# Patient Record
Sex: Male | Born: 1965
Health system: Southern US, Community
[De-identification: ages and names within clinical notes are randomized; demographics above are authoritative.]

## PROBLEM LIST (undated history)

## (undated) DIAGNOSIS — K859 Acute pancreatitis without necrosis or infection, unspecified: Secondary | ICD-10-CM

## (undated) DIAGNOSIS — I10 Essential (primary) hypertension: Secondary | ICD-10-CM

## (undated) DIAGNOSIS — F1910 Other psychoactive substance abuse, uncomplicated: Secondary | ICD-10-CM

## (undated) HISTORY — DX: Essential (primary) hypertension: I10

## (undated) HISTORY — PX: OTHER SURGICAL HISTORY: SHX169

## (undated) HISTORY — PX: HEMORRHOID SURGERY: SHX153

---

## 2000-12-28 ENCOUNTER — Encounter: Payer: Self-pay | Admitting: Emergency Medicine

## 2000-12-28 ENCOUNTER — Emergency Department (HOSPITAL_COMMUNITY): Admission: EM | Admit: 2000-12-28 | Discharge: 2000-12-28 | Payer: Self-pay | Admitting: Emergency Medicine

## 2013-12-02 ENCOUNTER — Encounter (HOSPITAL_COMMUNITY): Payer: Self-pay | Admitting: Emergency Medicine

## 2013-12-02 ENCOUNTER — Emergency Department (INDEPENDENT_AMBULATORY_CARE_PROVIDER_SITE_OTHER)
Admission: EM | Admit: 2013-12-02 | Discharge: 2013-12-02 | Disposition: A | Discharge status: Home / Self Care | Payer: Self-pay | Source: Home / Self Care | Attending: Family Medicine | Admitting: Family Medicine

## 2013-12-02 DIAGNOSIS — I10 Essential (primary) hypertension: Secondary | ICD-10-CM

## 2013-12-02 DIAGNOSIS — F1010 Alcohol abuse, uncomplicated: Secondary | ICD-10-CM

## 2013-12-02 MED ORDER — HYDROCHLOROTHIAZIDE 25 MG PO TABS: 25.0000 mg | ORAL_TABLET | Freq: Every day | ORAL | Status: DC

## 2013-12-02 MED ORDER — AMLODIPINE BESYLATE 5 MG PO TABS: 5.0000 mg | ORAL_TABLET | Freq: Every day | ORAL | Status: DC

## 2013-12-02 MED ORDER — LISINOPRIL 40 MG PO TABS: 40.0000 mg | ORAL_TABLET | Freq: Every day | ORAL | Status: DC

## 2013-12-02 NOTE — ED Notes (Signed)
Out of medication for his BP for a couple of days

## 2013-12-02 NOTE — Discharge Instructions (Signed)
DASH Diet °The DASH diet stands for "Dietary Approaches to Stop Hypertension." It is a healthy eating plan that has been shown to reduce high blood pressure (hypertension) in as little as 14 days, while also possibly providing other significant health benefits. These other health benefits include reducing the risk of breast cancer after menopause and reducing the risk of type 2 diabetes, heart disease, colon cancer, and stroke. Health benefits also include weight loss and slowing kidney failure in patients with chronic kidney disease.  °DIET GUIDELINES °· Limit salt (sodium). Your diet should contain less than 1500 mg of sodium daily. °· Limit refined or processed carbohydrates. Your diet should include mostly whole grains. Desserts and added sugars should be used sparingly. °· Include small amounts of heart-healthy fats. These types of fats include nuts, oils, and tub margarine. Limit saturated and trans fats. These fats have been shown to be harmful in the body. °CHOOSING FOODS  °The following food groups are based on a 2000 calorie diet. See your Registered Dietitian for individual calorie needs. °Grains and Grain Products (6 to 8 servings daily) °· Eat More Often: Whole-wheat bread, brown rice, whole-grain or wheat pasta, quinoa, popcorn without added fat or salt (air popped). °· Eat Less Often: White bread, white pasta, white rice, cornbread. °Vegetables (4 to 5 servings daily) °· Eat More Often: Fresh, frozen, and canned vegetables. Vegetables may be raw, steamed, roasted, or grilled with a minimal amount of fat. °· Eat Less Often/Avoid: Creamed or fried vegetables. Vegetables in a cheese sauce. °Fruit (4 to 5 servings daily) °· Eat More Often: All fresh, canned (in natural juice), or frozen fruits. Dried fruits without added sugar. One hundred percent fruit juice (½ cup [237 mL] daily). °· Eat Less Often: Dried fruits with added sugar. Canned fruit in light or heavy syrup. °Lean Meats, Fish, and Poultry (2  servings or less daily. One serving is 3 to 4 oz [85-114 g]). °· Eat More Often: Ninety percent or leaner ground beef, tenderloin, sirloin. Round cuts of beef, chicken breast, turkey breast. All fish. Grill, bake, or broil your meat. Nothing should be fried. °· Eat Less Often/Avoid: Fatty cuts of meat, turkey, or chicken leg, thigh, or wing. Fried cuts of meat or fish. °Dairy (2 to 3 servings) °· Eat More Often: Low-fat or fat-free milk, low-fat plain or light yogurt, reduced-fat or part-skim cheese. °· Eat Less Often/Avoid: Milk (whole, 2%). Whole milk yogurt. Full-fat cheeses. °Nuts, Seeds, and Legumes (4 to 5 servings per week) °· Eat More Often: All without added salt. °· Eat Less Often/Avoid: Salted nuts and seeds, canned beans with added salt. °Fats and Sweets (limited) °· Eat More Often: Vegetable oils, tub margarines without trans fats, sugar-free gelatin. Mayonnaise and salad dressings. °· Eat Less Often/Avoid: Coconut oils, palm oils, butter, stick margarine, cream, half and half, cookies, candy, pie. °FOR MORE INFORMATION °The Dash Diet Eating Plan: www.dashdiet.org °Document Released: 07/28/2011 Document Revised: 10/31/2011 Document Reviewed: 07/28/2011 °ExitCare® Patient Information ©2014 [employer]. ° °Arterial Hypertension °Arterial hypertension (high blood pressure) is a condition of elevated pressure in your blood vessels. Hypertension over a long period of time is a risk factor for strokes, heart attacks, and heart failure. It is also the leading cause of kidney (renal) failure.  °CAUSES  °· In Adults -- Over 90% of all hypertension has no known cause. This is called essential or primary hypertension. In the other 10% of people with hypertension, the increase in blood pressure is caused by another disorder. This   is called secondary hypertension. Important causes of secondary hypertension are: °· Heavy alcohol use. °· Obstructive sleep apnea. °· Hyperaldosterosim (Conn's syndrome). °· Steroid  use. °· Chronic kidney failure. °· Hyperparathyroidism. °· Medications. °· Renal artery stenosis. °· Pheochromocytoma. °· Cushing's disease. °· Coarctation of the aorta. °· Scleroderma renal crisis. °· Licorice (in excessive amounts). °· Drugs (cocaine, methamphetamine). °Your caregiver can explain any items above that apply to you. °· In Children -- Secondary hypertension is more common and should always be considered. °· Pregnancy -- Few women of childbearing age have high blood pressure. However, up to 10% of them develop hypertension of pregnancy. Generally, this will not harm the woman. It may be a sign of 3 complications of pregnancy: preeclampsia, HELLP syndrome, and eclampsia. Follow up and control with medication is necessary. °SYMPTOMS  °· This condition normally does not produce any noticeable symptoms. It is usually found during a routine exam. °· Malignant hypertension is a late problem of high blood pressure. It may have the following symptoms: °· Headaches. °· Blurred vision. °· End-organ damage (this means your kidneys, heart, lungs, and other organs are being damaged). °· Stressful situations can increase the blood pressure. If a person with normal blood pressure has their blood pressure go up while being seen by their caregiver, this is often termed "white coat hypertension." Its importance is not known. It may be related with eventually developing hypertension or complications of hypertension. °· Hypertension is often confused with mental tension, stress, and anxiety. °DIAGNOSIS  °The diagnosis is made by 3 separate blood pressure measurements. They are taken at least 1 week apart from each other. If there is organ damage from hypertension, the diagnosis may be made without repeat measurements. °Hypertension is usually identified by having blood pressure readings: °· Above 140/90 mmHg measured in both arms, at 3 separate times, over a couple weeks. °· Over 130/80 mmHg should be considered a risk  factor and may require treatment in patients with diabetes. °Blood pressure readings over 120/80 mmHg are called "pre-hypertension" even in non-diabetic patients. °To get a true blood pressure measurement, use the following guidelines. Be aware of the factors that can alter blood pressure readings. °· Take measurements at least 1 hour after caffeine. °· Take measurements 30 minutes after smoking and without any stress. This is another reason to quit smoking  it raises your blood pressure. °· Use a proper cuff size. Ask your caregiver if you are not sure about your cuff size. °· Most home blood pressure cuffs are automatic. They will measure systolic and diastolic pressures. The systolic pressure is the pressure reading at the start of sounds. Diastolic pressure is the pressure at which the sounds disappear. If you are elderly, measure pressures in multiple postures. Try sitting, lying or standing. °· Sit at rest for a minimum of 5 minutes before taking measurements. °· You should not be on any medications like decongestants. These are found in many cold medications. °· Record your blood pressure readings and review them with your caregiver. °If you have hypertension: °· Your caregiver may do tests to be sure you do not have secondary hypertension (see "causes" above). °· Your caregiver may also look for signs of metabolic syndrome. This is also called Syndrome X or Insulin Resistance Syndrome. You may have this syndrome if you have type 2 diabetes, abdominal obesity, and abnormal blood lipids in addition to hypertension. °· Your caregiver will take your medical and family history and perform a physical exam. °· Diagnostic tests   may include blood tests (for glucose, cholesterol, potassium, and kidney function), a urinalysis, or an EKG. Other tests may also be necessary depending on your condition. °PREVENTION  °There are important lifestyle issues that you can adopt to reduce your chance of developing  hypertension: °· Maintain a normal weight. °· Limit the amount of salt (sodium) in your diet. °· Exercise often. °· Limit alcohol intake. °· Get enough potassium in your diet. Discuss specific advice with your caregiver. °· Follow a DASH diet (dietary approaches to stop hypertension). This diet is rich in fruits, vegetables, and low-fat dairy products, and avoids certain fats. °PROGNOSIS  °Essential hypertension cannot be cured. Lifestyle changes and medical treatment can lower blood pressure and reduce complications. The prognosis of secondary hypertension depends on the underlying cause. Many people whose hypertension is controlled with medicine or lifestyle changes can live a normal, healthy life.  °RISKS AND COMPLICATIONS  °While high blood pressure alone is not an illness, it often requires treatment due to its short- and long-term effects on many organs. Hypertension increases your risk for: °· CVAs or strokes (cerebrovascular accident). °· Heart failure due to chronically high blood pressure (hypertensive cardiomyopathy). °· Heart attack (myocardial infarction). °· Damage to the retina (hypertensive retinopathy). °· Kidney failure (hypertensive nephropathy). °Your caregiver can explain list items above that apply to you. Treatment of hypertension can significantly reduce the risk of complications. °TREATMENT  °· For overweight patients, weight loss and regular exercise are recommended. Physical fitness lowers blood pressure. °· Mild hypertension is usually treated with diet and exercise. A diet rich in fruits and vegetables, fat-free dairy products, and foods low in fat and salt (sodium) can help lower blood pressure. Decreasing salt intake decreases blood pressure in a 1/3 of people. °· Stop smoking if you are a smoker. °The steps above are highly effective in reducing blood pressure. While these actions are easy to suggest, they are difficult to achieve. Most patients with moderate or severe hypertension  end up requiring medications to bring their blood pressure down to a normal level. There are several classes of medications for treatment. Blood pressure pills (antihypertensives) will lower blood pressure by their different actions. Lowering the blood pressure by 10 mmHg may decrease the risk of complications by as much as 25%. °The goal of treatment is effective blood pressure control. This will reduce your risk for complications. Your caregiver will help you determine the best treatment for you according to your lifestyle. What is excellent treatment for one person, may not be for you. °HOME CARE INSTRUCTIONS  °· Do not smoke. °· Follow the lifestyle changes outlined in the "Prevention" section. °· If you are on medications, follow the directions carefully. Blood pressure medications must be taken as prescribed. Skipping doses reduces their benefit. It also puts you at risk for problems. °· Follow up with your caregiver, as directed. °· If you are asked to monitor your blood pressure at home, follow the guidelines in the "Diagnosis" section above. °SEEK MEDICAL CARE IF:  °· You think you are having medication side effects. °· You have recurrent headaches or lightheadedness. °· You have swelling in your ankles. °· You have trouble with your vision. °SEEK IMMEDIATE MEDICAL CARE IF:  °· You have sudden onset of chest pain or pressure, difficulty breathing, or other symptoms of a heart attack. °· You have a severe headache. °· You have symptoms of a stroke (such as sudden weakness, difficulty speaking, difficulty walking). °MAKE SURE YOU:  °· Understand these instructions. °·   Will watch your condition.  Will get help right away if you are not doing well or get worse. Document Released: 08/08/2005 Document Revised: 10/31/2011 Document Reviewed: 03/08/2007 [facility] Patient Information 2014 [place]ExitCare, [place].  How Much is Too Much Alcohol? Drinking too much alcohol can cause injury, accidents, and health problems.  These types of problems can include:   Car crashes.  Falls.  Family fighting (domestic violence).  Drowning.  Fights.  Injuries.  Burns.  Damage to certain organs.  Having a baby with birth defects. ONE DRINK CAN BE TOO MUCH WHEN YOU ARE:  Working.  Pregnant or breastfeeding.  Taking medicines. Ask your doctor.  Driving or planning to drive. WHAT IS A STANDARD DRINK?   1 regular beer (12 ounces or 360 milliliters).  1 glass of wine (5 ounces or 150 milliliters).  1 shot of liquor (1.5 ounces or 45 milliliters). BLOOD ALCOHOL LEVELS   .00 A person is sober.  Marland Kitchen.03 A person has no trouble keeping balance, talking, or seeing right, but a "buzz" may be felt.  Marland Kitchen.05 A person feels "buzzed" and relaxed.  Marland Kitchen.08 or .10  A person is drunk. He or she has trouble talking, seeing right, and keeping his or her balance.  .15 A person loses body control and may pass out (blackout).  .20 A person has trouble walking (staggering) and throws up (vomits).  .30 A person will pass out (unconscious).  .40+ A person will be in a coma. Death is possible. If you or someone you know has a drinking problem, get help from a doctor.  Document Released: 06/04/2009 Document Revised: 10/31/2011 Document Reviewed: 06/04/2009 [facility] PlaceExitCare Patient Information 2014 [place]ExitCare, MarylandLLC.  Hypertension As your heart beats, it forces blood through your arteries. This force is your blood pressure. If the pressure is too high, it is called hypertension (HTN) or high blood pressure. HTN is dangerous because you may have it and not know it. High blood pressure may mean that your heart has to work harder to pump blood. Your arteries may be narrow or stiff. The extra work puts you at risk for heart disease, stroke, and other problems.  Blood pressure consists of two numbers, a higher number over a lower, 110/72, for example. It is stated as "110 over 72." The ideal is below 120 for the top number (systolic) and under  80 for the bottom (diastolic). Write down your blood pressure today. You should pay close attention to your blood pressure if you have certain conditions such as:  Heart failure.  Prior heart attack.  Diabetes  Chronic kidney disease.  Prior stroke.  Multiple risk factors for heart disease. To see if you have HTN, your blood pressure should be measured while you are seated with your arm held at the level of the heart. It should be measured at least twice. A one-time elevated blood pressure reading (especially in the Emergency Department) does not mean that you need treatment. There may be conditions in which the blood pressure is different between your right and left arms. It is important to see your caregiver soon for a recheck. Most people have essential hypertension which means that there is not a specific cause. This type of high blood pressure may be lowered by changing lifestyle factors such as:  Stress.  Smoking.  Lack of exercise.  Excessive weight.  Drug/tobacco/alcohol use.  Eating less salt. Most people do not have symptoms from high blood pressure until it has caused damage to the body. Effective treatment  can often prevent, delay or reduce that damage. TREATMENT  When a cause has been identified, treatment for high blood pressure is directed at the cause. There are a large number of medications to treat HTN. These fall into several categories, and your caregiver will help you select the medicines that are best for you. Medications may have side effects. You should review side effects with your caregiver. If your blood pressure stays high after you have made lifestyle changes or started on medicines,   Your medication(s) may need to be changed.  Other problems may need to be addressed.  Be certain you understand your prescriptions, and know how and when to take your medicine.  Be sure to follow up with your caregiver within the time frame advised (usually within two  weeks) to have your blood pressure rechecked and to review your medications.  If you are taking more than one medicine to lower your blood pressure, make sure you know how and at what times they should be taken. Taking two medicines at the same time can result in blood pressure that is too low. SEEK IMMEDIATE MEDICAL CARE IF:  You develop a severe headache, blurred or changing vision, or confusion.  You have unusual weakness or numbness, or a faint feeling.  You have severe chest or abdominal pain, vomiting, or breathing problems. MAKE SURE YOU:   Understand these instructions.  Will watch your condition.  Will get help right away if you are not doing well or get worse. Document Released: 08/08/2005 Document Revised: 10/31/2011 Document Reviewed: 03/28/2008 [facility]ExitCare Patient Information 2014 [place]ExitCare, MarylandLLC.

## 2013-12-02 NOTE — ED Provider Notes (Signed)
CSN: [id number]     Arrival date & time 12/02/13  1644 History   First MD Initiated Contact with Patient 12/02/13 1745     Chief Complaint  Patient presents with  . Medication Refill   (Consider location/radiation/quality/duration/timing/severity/associated sxs/prior Treatment) HPI Comments: Patient states he was recently discharge from prison and has run out of medications used to control his blood pressure. Significant other also reports that he has returned to drinking 8-10 beers a day. He has not yet made the effort to locate a primary care provider. [facility]   The history is provided by the patient and the spouse.    History reviewed. No pertinent past medical history. History reviewed. No pertinent past surgical history. History reviewed. No pertinent family history. History  Substance Use Topics  . Smoking status: Current Every Day Smoker  . Smokeless tobacco: Not on file  . Alcohol Use: Yes    Review of Systems  All other systems reviewed and are negative.   Allergies  Review of patient's allergies indicates no known allergies.  Home Medications   Current Outpatient Rx  Name  Route  Sig  Dispense  Refill  . amLODipine (NORVASC) 5 MG tablet   Oral   Take 5 mg by mouth daily.         Marland Kitchen. aspirin (ASPIRIN EC) 81 MG EC tablet   Oral   Take 81 mg by mouth daily. Swallow whole.         . hydrochlorothiazide (HYDRODIURIL) 25 MG tablet   Oral   Take 25 mg by mouth daily.         Marland Kitchen. lisinopril (PRINIVIL,ZESTRIL) 40 MG tablet   Oral   Take 40 mg by mouth daily.         Marland Kitchen. amLODipine (NORVASC) 5 MG tablet   Oral   Take 1 tablet (5 mg total) by mouth daily.   30 tablet   1   . hydrochlorothiazide (HYDRODIURIL) 25 MG tablet   Oral   Take 1 tablet (25 mg total) by mouth daily.   30 tablet   1   . lisinopril (PRINIVIL,ZESTRIL) 40 MG tablet   Oral   Take 1 tablet (40 mg total) by mouth daily.   30 tablet   1    BP  159/77  Pulse 88  Temp(Src) 99.1 F (37.3 C) (Oral)  Resp 16  SpO2 100% Physical Exam  Nursing note and vitals reviewed. Constitutional: He is oriented to person, place, and time. He appears well-developed and well-nourished. No distress.  HENT:  Head: Normocephalic and atraumatic.  Neck: No JVD present.  Cardiovascular: Regular rhythm and normal heart sounds.  Tachycardia present.   Pulmonary/Chest: Effort normal and breath sounds normal.  Abdominal: Soft. Bowel sounds are normal. He exhibits no distension. There is no tenderness.  Musculoskeletal: Normal range of motion.  Neurological: He is alert and oriented to person, place, and time.  Skin: Skin is warm and dry. No rash noted.  Psychiatric: He has a normal mood and affect. His behavior is normal.    ED Course  Procedures (including critical care time) Labs Review Labs Reviewed - No data to display Imaging Review No results found.   MDM   1. Hypertension   2. Alcohol abuse, daily use    Provided limited Rxs for patient's daily BP medications and [facility] information [facility].   Jess BartersJennifer Lee Los ArcosPresson, GeorgiaPA 12/02/13 [phone]

## 2013-12-03 NOTE — ED Provider Notes (Signed)
Medical screening examination/treatment/procedure(s) were performed by resident physician or non-physician practitioner and as supervising physician I was immediately available for consultation/collaboration.   KINDL,Zhaire DOUGLAS MD.   Oluwadarasimi D Kindl, MD 12/03/13 0923 

## 2014-01-31 ENCOUNTER — Encounter (HOSPITAL_COMMUNITY): Payer: Self-pay | Admitting: Emergency Medicine

## 2014-01-31 ENCOUNTER — Emergency Department (INDEPENDENT_AMBULATORY_CARE_PROVIDER_SITE_OTHER)
Admission: EM | Admit: 2014-01-31 | Discharge: 2014-01-31 | Disposition: A | Discharge status: Home / Self Care | Payer: Self-pay | Source: Home / Self Care | Attending: Emergency Medicine | Admitting: Emergency Medicine

## 2014-01-31 DIAGNOSIS — I10 Essential (primary) hypertension: Secondary | ICD-10-CM

## 2014-01-31 MED ORDER — AMLODIPINE BESYLATE 5 MG PO TABS
5.0000 mg | ORAL_TABLET | Freq: Every day | ORAL | Status: DC
Start: 2014-01-31 — End: 2014-05-02

## 2014-01-31 MED ORDER — LISINOPRIL 40 MG PO TABS: 40.0000 mg | ORAL_TABLET | Freq: Every day | ORAL | Status: DC

## 2014-01-31 MED ORDER — HYDROCHLOROTHIAZIDE 25 MG PO TABS
25.0000 mg | ORAL_TABLET | Freq: Every day | ORAL | Status: DC
Start: 2014-01-31 — End: 2014-05-02

## 2014-01-31 NOTE — ED Notes (Addendum)
Reports needing medication refill-took last blood pressure medicine this am. Patient reports he is trying to get medicaid

## 2014-01-31 NOTE — Discharge Instructions (Signed)

## 2014-01-31 NOTE — ED Provider Notes (Signed)
Medical screening examination/treatment/procedure(s) were performed by non-physician practitioner and as supervising physician I was immediately available for consultation/collaboration.  Immaculate Crutcher, M.D.  Kaysin Brock C Rylen Swindler, MD 01/31/14 2205 

## 2014-01-31 NOTE — ED Provider Notes (Signed)
CSN: [id number]     Arrival date & time 01/31/14  1257 History   First MD Initiated Contact with Patient 01/31/14 1432     Chief Complaint  Patient presents with  . Medication Refill   (Consider location/radiation/quality/duration/timing/severity/associated sxs/prior Treatment) HPI Comments: Patient presents requesting refills of his medications for hypertension. He takes amlodipine 5 mg, HCTZ 25 mg, and lisinopril 40 mg daily. He took his last dose of these medications this morning. He was seen here about 2 months ago and was prescribed 2 months of these medications and was instructed to followup with primary care. He claims that he looked for the primary care office that he was told to go to but he couldn't find it so she stopped looking. He denies any symptoms at this time. He is in the process of establishing Medicaid insurance.   History reviewed. No pertinent past medical history. History reviewed. No pertinent past surgical history. No family history on file. History  Substance Use Topics  . Smoking status: Current Every Day Smoker  . Smokeless tobacco: Not on file  . Alcohol Use: Yes    Review of Systems  Constitutional: Negative for fever, chills and fatigue.  HENT: Negative for sore throat.   Eyes: Negative for visual disturbance.  Respiratory: Negative for cough and shortness of breath.   Cardiovascular: Negative for chest pain, palpitations and leg swelling.  Gastrointestinal: Negative for nausea, vomiting, abdominal pain, diarrhea and constipation.  Genitourinary: Negative for dysuria, urgency, frequency and hematuria.  Musculoskeletal: Negative for arthralgias, myalgias, neck pain and neck stiffness.  Skin: Negative for rash.  Neurological: Negative for dizziness, weakness and light-headedness.  All other systems reviewed and are negative.   Allergies  Review of patient's allergies indicates no known allergies.  Home Medications   Prior to Admission medications    Medication Sig Start Date End Date Taking? Authorizing Provider  amLODipine (NORVASC) 5 MG tablet Take 5 mg by mouth daily.   Yes Historical Provider, MD  aspirin (ASPIRIN EC) 81 MG EC tablet Take 81 mg by mouth daily. Swallow whole.   Yes Historical Provider, MD  hydrochlorothiazide (HYDRODIURIL) 25 MG tablet Take 25 mg by mouth daily.   Yes Historical Provider, MD  lisinopril (PRINIVIL,ZESTRIL) 40 MG tablet Take 40 mg by mouth daily.   Yes Historical Provider, MD  amLODipine (NORVASC) 5 MG tablet Take 1 tablet (5 mg total) by mouth daily. 01/31/14   Graylon GoodZachary H Ciin Brazzel, PA-C  hydrochlorothiazide (HYDRODIURIL) 25 MG tablet Take 1 tablet (25 mg total) by mouth daily. 01/31/14   Adrian BlackwaterZachary H Troy Hartzog, PA-C  lisinopril (PRINIVIL,ZESTRIL) 40 MG tablet Take 1 tablet (40 mg total) by mouth daily. 01/31/14   Adrian BlackwaterZachary H Alzena Gerber, PA-C   BP 159/88  Pulse 94  Temp(Src) 98.6 F (37 C) (Oral)  Resp 16  SpO2 96% Physical Exam  Nursing note and vitals reviewed. Constitutional: He is oriented to person, place, and time. He appears well-developed and well-nourished. No distress.  HENT:  Head: Normocephalic.  Cardiovascular: Normal rate, regular rhythm and normal heart sounds.  Exam reveals no gallop and no friction rub.   No murmur heard. No peripheral edema   Pulmonary/Chest: Effort normal and breath sounds normal. No respiratory distress. He has no wheezes. He has no rales.  Neurological: He is alert and oriented to person, place, and time. Coordination normal.  Skin: Skin is warm and dry. No rash noted. He is not diaphoretic.  Psychiatric: He has a normal mood and affect. Judgment normal.  ED Course  Procedures (including critical care time) Labs Review Labs Reviewed - No data to display  Imaging Review No results found.   MDM   1. Hypertension    will refill medications. I have discussed with him the importance of establishing a primary care provider. I will give him 2 months of medications again,  but I have strongly encouraged him to establish care with a primary care physician before his medications run out.   Meds ordered this encounter  Medications  . amLODipine (NORVASC) 5 MG tablet    Sig: Take 1 tablet (5 mg total) by mouth daily.    Dispense:  30 tablet    Refill:  1  . hydrochlorothiazide (HYDRODIURIL) 25 MG tablet    Sig: Take 1 tablet (25 mg total) by mouth daily.    Dispense:  30 tablet    Refill:  1  . lisinopril (PRINIVIL,ZESTRIL) 40 MG tablet    Sig: Take 1 tablet (40 mg total) by mouth daily.    Dispense:  30 tablet    Refill:  1      Graylon GoodZachary H Draper Gallon, PA-C 01/31/14 1948

## 2014-05-02 ENCOUNTER — Encounter (HOSPITAL_COMMUNITY): Payer: Self-pay | Admitting: Emergency Medicine

## 2014-05-02 ENCOUNTER — Emergency Department (INDEPENDENT_AMBULATORY_CARE_PROVIDER_SITE_OTHER)
Admission: EM | Admit: 2014-05-02 | Discharge: 2014-05-02 | Disposition: A | Discharge status: Home / Self Care | Payer: Self-pay | Source: Home / Self Care | Attending: Family Medicine | Admitting: Family Medicine

## 2014-05-02 DIAGNOSIS — I10 Essential (primary) hypertension: Secondary | ICD-10-CM

## 2014-05-02 LAB — POCT I-STAT, CHEM 8
BUN: 7 mg/dL (ref 6–23)
CALCIUM ION: 1.12 mmol/L (ref 1.12–1.23)
CHLORIDE: 104 meq/L (ref 96–112)
Creatinine, Ser: 1 mg/dL (ref 0.50–1.35)
GLUCOSE: 109 mg/dL — AB (ref 70–99)
HCT: 51 % (ref 39.0–52.0)
Hemoglobin: 17.3 g/dL — ABNORMAL HIGH (ref 13.0–17.0)
Potassium: 4.2 mEq/L (ref 3.7–5.3)
Sodium: 137 mEq/L (ref 137–147)
TCO2: 25 mmol/L (ref 0–100)

## 2014-05-02 MED ORDER — HYDROCHLOROTHIAZIDE 25 MG PO TABS: 25.0000 mg | ORAL_TABLET | Freq: Every day | ORAL | Status: DC

## 2014-05-02 MED ORDER — LISINOPRIL 40 MG PO TABS: 40.0000 mg | ORAL_TABLET | Freq: Every day | ORAL | Status: DC

## 2014-05-02 MED ORDER — AMLODIPINE BESYLATE 5 MG PO TABS: 5.0000 mg | ORAL_TABLET | Freq: Every day | ORAL | Status: DC

## 2014-05-02 NOTE — Discharge Instructions (Signed)

## 2014-05-02 NOTE — ED Provider Notes (Signed)
Medical screening examination/treatment/procedure(s) were performed by resident physician or non-physician practitioner and as supervising physician I was immediately available for consultation/collaboration.   Beata Beason,Nahum DOUGLAS MD.   Ugo D Dalyn Becker, MD 05/02/14 1325 

## 2014-05-02 NOTE — ED Provider Notes (Signed)
CSN: [id number]     Arrival date & time 05/02/14  0844 History   First MD Initiated Contact with Patient 05/02/14 0913     Chief Complaint  Patient presents with  . Medication Refill   (Consider location/radiation/quality/duration/timing/severity/associated sxs/prior Treatment) HPI Comments: 48 year old male presents requesting refills of his blood pressure medications. Was seen here for this about 3 months ago, he says that he has not been able to establish primary care yet because he cannot get in touch with the people at the clinic. He denies any symptoms whatsoever at this time. No fever, chills, NVD, chest pain, shortness of breath, leg swelling.   History reviewed. No pertinent past medical history. History reviewed. No pertinent past surgical history. No family history on file. History  Substance Use Topics  . Smoking status: Current Every Day Smoker -- 0.50 packs/day    Types: Cigarettes  . Smokeless tobacco: Not on file  . Alcohol Use: Yes    Review of Systems  Constitutional: Negative for fever, chills and fatigue.  HENT: Negative for sore throat.   Eyes: Negative for visual disturbance.  Respiratory: Negative for cough and shortness of breath.   Cardiovascular: Negative for chest pain, palpitations and leg swelling.  Gastrointestinal: Negative for nausea, vomiting, abdominal pain, diarrhea and constipation.  Genitourinary: Negative for dysuria, urgency, frequency and hematuria.  Musculoskeletal: Negative for arthralgias, myalgias, neck pain and neck stiffness.  Skin: Negative for rash.  Neurological: Negative for dizziness, weakness and light-headedness.  All other systems reviewed and are negative.   Allergies  Review of patient's allergies indicates no known allergies.  Home Medications   Prior to Admission medications   Medication Sig Start Date End Date Taking? Authorizing Provider  aspirin (ASPIRIN EC) 81 MG EC tablet Take 81 mg by mouth daily. Swallow  whole.   Yes Historical Provider, MD  hydrochlorothiazide (HYDRODIURIL) 25 MG tablet Take 25 mg by mouth daily.   Yes Historical Provider, MD  amLODipine (NORVASC) 5 MG tablet Take 5 mg by mouth daily.    Historical Provider, MD  amLODipine (NORVASC) 5 MG tablet Take 1 tablet (5 mg total) by mouth daily. 05/02/14   Graylon Good, PA-C  hydrochlorothiazide (HYDRODIURIL) 25 MG tablet Take 1 tablet (25 mg total) by mouth daily. 05/02/14   Adrian Blackwater Mosiah Bastin, PA-C  lisinopril (PRINIVIL,ZESTRIL) 40 MG tablet Take 40 mg by mouth daily.    Historical Provider, MD  lisinopril (PRINIVIL,ZESTRIL) 40 MG tablet Take 1 tablet (40 mg total) by mouth daily. 05/02/14   Adrian Blackwater Masaye Gatchalian, PA-C   BP 158/86  Pulse 115  Temp(Src) 98.3 F (36.8 C) (Oral)  Resp 16  SpO2 98% Physical Exam  Nursing note and vitals reviewed. Constitutional: He is oriented to person, place, and time. He appears well-developed and well-nourished. No distress.  HENT:  Head: Normocephalic.  Cardiovascular: Normal rate, regular rhythm, normal heart sounds and intact distal pulses.   Pulse rechecked manually and was 85 beats per minute  Pulmonary/Chest: Effort normal and breath sounds normal. No respiratory distress.  Neurological: He is alert and oriented to person, place, and time. Coordination normal.  Skin: Skin is warm and dry. No rash noted. He is not diaphoretic.  Psychiatric: He has a normal mood and affect. Judgment normal.    ED Course  Procedures (including critical care time) Labs Review Labs Reviewed  POCT I-STAT, CHEM 8 - Abnormal; Notable for the following:    Glucose, Bld 109 (*)    Hemoglobin 17.3 (*)  All other components within normal limits    Imaging Review No results found.   MDM   1. Essential hypertension    Refilled for 1 month.  No more refills here, needs to establish PCP.  Istat normal.     Meds ordered this encounter  Medications  . amLODipine (NORVASC) 5 MG tablet    Sig: Take 1 tablet  (5 mg total) by mouth daily.    Dispense:  30 tablet    Refill:  0    Order Specific Question:  Supervising Provider    Answer:  Linna Hoff [phone]  . lisinopril (PRINIVIL,ZESTRIL) 40 MG tablet    Sig: Take 1 tablet (40 mg total) by mouth daily.    Dispense:  30 tablet    Refill:  0    Order Specific Question:  Supervising Provider    Answer:  Linna Hoff [phone]  . hydrochlorothiazide (HYDRODIURIL) 25 MG tablet    Sig: Take 1 tablet (25 mg total) by mouth daily.    Dispense:  30 tablet    Refill:  0    Order Specific Question:  Supervising Provider    Answer:  Bradd Canary D [[id number]]       Graylon Good, PA-C 05/02/14 [phone]

## 2014-05-02 NOTE — ED Notes (Signed)
Needing refill on his BP meds; ran out x5 days ago He is asymptomatic; does not have PCP Smokes 0.5 PPD Alert, no signs of acute distress.

## 2014-05-27 ENCOUNTER — Encounter: Payer: Self-pay | Admitting: Internal Medicine

## 2014-05-27 ENCOUNTER — Ambulatory Visit: Payer: Self-pay | Attending: Internal Medicine | Admitting: Internal Medicine

## 2014-05-27 VITALS — BP 140/90 | HR 100 | Temp 98.8°F | Resp 16 | Wt 170.4 lb

## 2014-05-27 DIAGNOSIS — I10 Essential (primary) hypertension: Secondary | ICD-10-CM

## 2014-05-27 DIAGNOSIS — Z23 Encounter for immunization: Secondary | ICD-10-CM

## 2014-05-27 DIAGNOSIS — Z139 Encounter for screening, unspecified: Secondary | ICD-10-CM

## 2014-05-27 DIAGNOSIS — Z823 Family history of stroke: Secondary | ICD-10-CM | POA: Insufficient documentation

## 2014-05-27 DIAGNOSIS — Z7982 Long term (current) use of aspirin: Secondary | ICD-10-CM | POA: Insufficient documentation

## 2014-05-27 DIAGNOSIS — F17200 Nicotine dependence, unspecified, uncomplicated: Secondary | ICD-10-CM

## 2014-05-27 DIAGNOSIS — Z833 Family history of diabetes mellitus: Secondary | ICD-10-CM

## 2014-05-27 DIAGNOSIS — F17210 Nicotine dependence, cigarettes, uncomplicated: Secondary | ICD-10-CM | POA: Insufficient documentation

## 2014-05-27 DIAGNOSIS — Z8249 Family history of ischemic heart disease and other diseases of the circulatory system: Secondary | ICD-10-CM | POA: Insufficient documentation

## 2014-05-27 MED ORDER — VARENICLINE TARTRATE 0.5 MG X 11 & 1 MG X 42 PO MISC: ORAL | Status: DC

## 2014-05-27 MED ORDER — HYDROCHLOROTHIAZIDE 25 MG PO TABS: 25.0000 mg | ORAL_TABLET | Freq: Every day | ORAL | Status: DC

## 2014-05-27 MED ORDER — LISINOPRIL 40 MG PO TABS: 40.0000 mg | ORAL_TABLET | Freq: Every day | ORAL | Status: DC

## 2014-05-27 MED ORDER — AMLODIPINE BESYLATE 5 MG PO TABS: 5.0000 mg | ORAL_TABLET | Freq: Every day | ORAL | Status: DC

## 2014-05-27 NOTE — Progress Notes (Signed)
Patient here to establish care Needs medications refilled

## 2014-05-27 NOTE — Progress Notes (Signed)
Patient Demographics  Jimmy Zimmerman, is a 48 y.o. male  UJW:[id number]  [id number]  DOB - [birth year]  CC:  Chief Complaint  Patient presents with  . Establish Care       HPI: Jimmy Zimmerman is a 48 y.o. male here today to establish medical care. History of hypertension for several years, currently patient taking amlodipine 5 mg hydrochlorothiazide 25 mg and lisinopril 40 mg, as per patient he took his medications today his blood pressure is borderline elevated denies any headache dizziness chest pain or shortness of breath, patient does smoke cigarettes, I have advised patient to quit smoking, he's going to try Chantix, denies any previous history of anxiety or depression, patient does report family history of diabetes. Patient has No headache, No chest pain, No abdominal pain - No Nausea, No new weakness tingling or numbness, No Cough - SOB.  No Known Allergies History reviewed. No pertinent past medical history. Current Outpatient Prescriptions on File Prior to Visit  Medication Sig Dispense Refill  . aspirin (ASPIRIN EC) 81 MG EC tablet Take 81 mg by mouth daily. Swallow whole.       No current facility-administered medications on file prior to visit.   Family History  Problem Relation Age of Onset  . Hypertension Mother   . Diabetes Mother   . Stroke Maternal Grandmother    History   Social History  . Marital Status: Single    Spouse Name: N/A    Number of Children: N/A  . Years of Education: N/A   Occupational History  . Not on file.   Social History Main Topics  . Smoking status: Current Every Day Smoker -- 0.50 packs/day for 30 years    Types: Cigarettes  . Smokeless tobacco: Not on file  . Alcohol Use: 3.6 oz/week    6 Cans of beer per week     Comment: everyday   . Drug Use: No  . Sexual Activity: Not on file   Other Topics Concern  . Not on file   Social History Narrative  . No narrative on file    Review of Systems: Constitutional:  Negative for fever, chills, diaphoresis, activity change, appetite change and fatigue. HENT: Negative for ear pain, nosebleeds, congestion, facial swelling, rhinorrhea, neck pain, neck stiffness and ear discharge.  Eyes: Negative for pain, discharge, redness, itching and visual disturbance. Respiratory: Negative for cough, choking, chest tightness, shortness of breath, wheezing and stridor.  Cardiovascular: Negative for chest pain, palpitations and leg swelling. Gastrointestinal: Negative for abdominal distention. Genitourinary: Negative for dysuria, urgency, frequency, hematuria, flank pain, decreased urine volume, difficulty urinating and dyspareunia.  Musculoskeletal: Negative for back pain, joint swelling, arthralgia and gait problem. Neurological: Negative for dizziness, tremors, seizures, syncope, facial asymmetry, speech difficulty, weakness, light-headedness, numbness and headaches.  Hematological: Negative for adenopathy. Does not bruise/bleed easily. Psychiatric/Behavioral: Negative for hallucinations, behavioral problems, confusion, dysphoric mood, decreased concentration and agitation.    Objective:   Filed Vitals:   05/27/14 1119  BP: 140/90  Pulse:   Temp:   Resp:     Physical Exam: Constitutional: Patient appears well-developed and well-nourished. No distress. HENT: Normocephalic, atraumatic, External right and left ear normal. Oropharynx is clear and moist.  Eyes: Conjunctivae and EOM are normal. PERRLA, no scleral icterus. Neck: Normal ROM. Neck supple. No JVD. No tracheal deviation. No thyromegaly. CVS: RRR, S1/S2 +, no murmurs, no gallops, no carotid bruit.  Pulmonary: Effort and breath sounds normal, no stridor, rhonchi, wheezes, rales.  Abdominal:  Soft. BS +, no distension, tenderness, rebound or guarding.  Musculoskeletal: Normal range of motion. No edema and no tenderness.  Neuro: Alert. Normal reflexes, muscle tone coordination. No cranial nerve deficit. Skin:  Skin is warm and dry. No rash noted. Not diaphoretic. No erythema. No pallor. Psychiatric: Normal mood and affect. Behavior, judgment, thought content normal.  Lab Results  Component Value Date   HGB 17.3* 05/02/2014   HCT 51.0 05/02/2014   Lab Results  Component Value Date   CREATININE 1.00 05/02/2014   BUN 7 05/02/2014   NA 137 05/02/2014   K 4.2 05/02/2014   CL 104 05/02/2014    No results found for this basename: HGBA1C   Lipid Panel  No results found for this basename: chol, trig, hdl, cholhdl, vldl, ldlcalc       Assessment and plan:   1. Encounter for immunization Flu shot given today.  2. Essential hypertension Advise for DASH diet  Medication refill done.  - lisinopril (PRINIVIL,ZESTRIL) 40 MG tablet; Take 1 tablet (40 mg total) by mouth daily.  Dispense: 30 tablet; Refill: 3 - hydrochlorothiazide (HYDRODIURIL) 25 MG tablet; Take 1 tablet (25 mg total) by mouth daily.  Dispense: 30 tablet; Refill: 3 - amLODipine (NORVASC) 5 MG tablet; Take 1 tablet (5 mg total) by mouth daily.  Dispense: 30 tablet; Refill: 3  3. Nicotine dependence, uncomplicated, unspecified nicotine product type Prescribed  - varenicline (CHANTIX STARTING MONTH PAK) 0.5 MG X 11 & 1 MG X 42 tablet; Take one 0.5 mg tablet by mouth once daily for 3 days, then increase to one 0.5 mg tablet twice daily for 4 days, then increase to one 1 mg tablet twice daily.  Dispense: 53 tablet; Refill: 0  4. Family history of diabetes mellitus (DM) Will check  - Hemoglobin A1c  5. Screening Ordered baseline blood work - CBC with Differential - COMPLETE METABOLIC PANEL WITH GFR - TSH - Lipid panel - Vit D  25 hydroxy (rtn osteoporosis monitoring)        Health Maintenance   -Influenza shot given today   Return in about 3 months (around 08/27/2014) for hypertension.      Doris Cheadle, MD

## 2014-05-27 NOTE — Patient Instructions (Signed)
DASH Eating Plan °DASH stands for "Dietary Approaches to Stop Hypertension." The DASH eating plan is a healthy eating plan that has been shown to reduce high blood pressure (hypertension). Additional health benefits may include reducing the risk of type 2 diabetes mellitus, heart disease, and stroke. The DASH eating plan may also help with weight loss. °WHAT DO I NEED TO KNOW ABOUT THE DASH EATING PLAN? °For the DASH eating plan, you will follow these general guidelines: °· Choose foods with a percent daily value for sodium of less than 5% (as listed on the food label). °· Use salt-free seasonings or herbs instead of table salt or sea salt. °· Check with your health care provider or pharmacist before using salt substitutes. °· Eat lower-sodium products, often labeled as "lower sodium" or "no salt added." °· Eat fresh foods. °· Eat more vegetables, fruits, and low-fat dairy products. °· Choose whole grains. Look for the word "whole" as the first word in the ingredient list. °· Choose fish and skinless chicken or turkey more often than red meat. Limit fish, poultry, and meat to 6 oz (170 g) each day. °· Limit sweets, desserts, sugars, and sugary drinks. °· Choose heart-healthy fats. °· Limit cheese to 1 oz (28 g) per day. °· Eat more home-cooked food and less restaurant, buffet, and fast food. °· Limit fried foods. °· Cook foods using methods other than frying. °· Limit canned vegetables. If you do use them, rinse them well to decrease the sodium. °· When eating at a restaurant, ask that your food be prepared with less salt, or no salt if possible. °WHAT FOODS CAN I EAT? °Seek help from a dietitian for individual calorie needs. °Grains °Whole grain or whole wheat bread. Brown rice. Whole grain or whole wheat pasta. Quinoa, bulgur, and whole grain cereals. Low-sodium cereals. Corn or whole wheat flour tortillas. Whole grain cornbread. Whole grain crackers. Low-sodium crackers. °Vegetables °Fresh or frozen vegetables  (raw, steamed, roasted, or grilled). Low-sodium or reduced-sodium tomato and vegetable juices. Low-sodium or reduced-sodium tomato sauce and paste. Low-sodium or reduced-sodium canned vegetables.  °Fruits °All fresh, canned (in natural juice), or frozen fruits. °Meat and Other Protein Products °Ground beef (85% or leaner), grass-fed beef, or beef trimmed of fat. Skinless chicken or turkey. Ground chicken or turkey. Pork trimmed of fat. All fish and seafood. Eggs. Dried beans, peas, or lentils. Unsalted nuts and seeds. Unsalted canned beans. °Dairy °Low-fat dairy products, such as skim or 1% milk, 2% or reduced-fat cheeses, low-fat ricotta or cottage cheese, or plain low-fat yogurt. Low-sodium or reduced-sodium cheeses. °Fats and Oils °Tub margarines without trans fats. Light or reduced-fat mayonnaise and salad dressings (reduced sodium). Avocado. Safflower, olive, or canola oils. Natural peanut or almond butter. °Other °Unsalted popcorn and pretzels. °The items listed above may not be a complete list of recommended foods or beverages. Contact your dietitian for more options. °WHAT FOODS ARE NOT RECOMMENDED? °Grains °White bread. White pasta. White rice. Refined cornbread. Bagels and croissants. Crackers that contain trans fat. °Vegetables °Creamed or fried vegetables. Vegetables in a cheese sauce. Regular canned vegetables. Regular canned tomato sauce and paste. Regular tomato and vegetable juices. °Fruits °Dried fruits. Canned fruit in light or heavy syrup. Fruit juice. °Meat and Other Protein Products °Fatty cuts of meat. Ribs, chicken wings, bacon, sausage, bologna, salami, chitterlings, fatback, hot dogs, bratwurst, and packaged luncheon meats. Salted nuts and seeds. Canned beans with salt. °Dairy °Whole or 2% milk, cream, half-and-half, and cream cheese. Whole-fat or sweetened yogurt. Full-fat   cheeses or blue cheese. Nondairy creamers and whipped toppings. Processed cheese, cheese spreads, or cheese  curds. °Condiments °Onion and garlic salt, seasoned salt, table salt, and sea salt. Canned and packaged gravies. Worcestershire sauce. Tartar sauce. Barbecue sauce. Teriyaki sauce. Soy sauce, including reduced sodium. Steak sauce. Fish sauce. Oyster sauce. Cocktail sauce. Horseradish. Ketchup and mustard. Meat flavorings and tenderizers. Bouillon cubes. Hot sauce. Tabasco sauce. Marinades. Taco seasonings. Relishes. °Fats and Oils °Butter, stick margarine, lard, shortening, ghee, and bacon fat. Coconut, palm kernel, or palm oils. Regular salad dressings. °Other °Pickles and olives. Salted popcorn and pretzels. °The items listed above may not be a complete list of foods and beverages to avoid. Contact your dietitian for more information. °WHERE CAN I FIND MORE INFORMATION? °[employer], [employer], [employer]: www.nhlbi.nih.gov/health/health-topics/topics/dash/ °Document Released: 07/28/2011 Document Revised: 12/23/2013 Document Reviewed: 06/12/2013 °[employer]® Patient Information ©2015 [employer]. This information is not intended to replace advice given to you by your health care provider. Make sure you discuss any questions you have with your health care provider. ° °

## 2014-09-08 ENCOUNTER — Ambulatory Visit: Payer: Self-pay | Admitting: Internal Medicine

## 2014-09-22 ENCOUNTER — Encounter: Payer: Self-pay | Admitting: Internal Medicine

## 2014-09-22 ENCOUNTER — Ambulatory Visit: Payer: Self-pay | Attending: Internal Medicine | Admitting: Internal Medicine

## 2014-09-22 VITALS — BP 140/90 | HR 99 | Temp 98.0°F | Resp 16 | Wt 170.2 lb

## 2014-09-22 DIAGNOSIS — Z7982 Long term (current) use of aspirin: Secondary | ICD-10-CM | POA: Insufficient documentation

## 2014-09-22 DIAGNOSIS — Z833 Family history of diabetes mellitus: Secondary | ICD-10-CM | POA: Insufficient documentation

## 2014-09-22 DIAGNOSIS — I10 Essential (primary) hypertension: Secondary | ICD-10-CM | POA: Insufficient documentation

## 2014-09-22 DIAGNOSIS — F17200 Nicotine dependence, unspecified, uncomplicated: Secondary | ICD-10-CM

## 2014-09-22 DIAGNOSIS — Z139 Encounter for screening, unspecified: Secondary | ICD-10-CM

## 2014-09-22 DIAGNOSIS — F17210 Nicotine dependence, cigarettes, uncomplicated: Secondary | ICD-10-CM | POA: Insufficient documentation

## 2014-09-22 LAB — CBC WITH DIFFERENTIAL/PLATELET
Basophils Absolute: 0 10*3/uL (ref 0.0–0.1)
Basophils Relative: 0 % (ref 0–1)
EOS ABS: 0 10*3/uL (ref 0.0–0.7)
EOS PCT: 0 % (ref 0–5)
HEMATOCRIT: 48.2 % (ref 39.0–52.0)
Hemoglobin: 16.8 g/dL (ref 13.0–17.0)
Lymphocytes Relative: 15 % (ref 12–46)
Lymphs Abs: 1.1 10*3/uL (ref 0.7–4.0)
MCH: 33.3 pg (ref 26.0–34.0)
MCHC: 34.9 g/dL (ref 30.0–36.0)
MCV: 95.4 fL (ref 78.0–100.0)
MONOS PCT: 9 % (ref 3–12)
MPV: 8.8 fL (ref 8.6–12.4)
Monocytes Absolute: 0.6 10*3/uL (ref 0.1–1.0)
Neutro Abs: 5.4 10*3/uL (ref 1.7–7.7)
Neutrophils Relative %: 76 % (ref 43–77)
Platelets: 274 10*3/uL (ref 150–400)
RBC: 5.05 MIL/uL (ref 4.22–5.81)
RDW: 14.6 % (ref 11.5–15.5)
WBC: 7.1 10*3/uL (ref 4.0–10.5)

## 2014-09-22 LAB — COMPLETE METABOLIC PANEL WITH GFR
ALT: 40 U/L (ref 0–53)
AST: 35 U/L (ref 0–37)
Albumin: 5.1 g/dL (ref 3.5–5.2)
Alkaline Phosphatase: 84 U/L (ref 39–117)
BUN: 9 mg/dL (ref 6–23)
CALCIUM: 10.1 mg/dL (ref 8.4–10.5)
CO2: 26 mEq/L (ref 19–32)
Chloride: 97 mEq/L (ref 96–112)
Creat: 1.03 mg/dL (ref 0.50–1.35)
GFR, Est African American: >89 mL/min
GFR, Est Non African American: 85 mL/min
Glucose, Bld: 107 mg/dL — ABNORMAL HIGH (ref 70–99)
Potassium: 4.8 mEq/L (ref 3.5–5.3)
SODIUM: 133 meq/L — AB (ref 135–145)
TOTAL PROTEIN: 7.8 g/dL (ref 6.0–8.3)
Total Bilirubin: 0.9 mg/dL (ref 0.2–1.2)

## 2014-09-22 LAB — LIPID PANEL
CHOL/HDL RATIO: 1.8 ratio
Cholesterol: 220 mg/dL — ABNORMAL HIGH (ref 0–200)
HDL: 122 mg/dL (ref >39)
LDL Cholesterol: 86 mg/dL (ref 0–99)
TRIGLYCERIDES: 58 mg/dL (ref <150)
VLDL: 12 mg/dL (ref 0–40)

## 2014-09-22 LAB — HEMOGLOBIN A1C
HEMOGLOBIN A1C: 5.5 % (ref <5.7)
Mean Plasma Glucose: 111 mg/dL (ref <117)

## 2014-09-22 MED ORDER — LISINOPRIL 40 MG PO TABS: 40.0000 mg | ORAL_TABLET | Freq: Every day | ORAL | Status: DC

## 2014-09-22 MED ORDER — HYDROCHLOROTHIAZIDE 25 MG PO TABS: 25.0000 mg | ORAL_TABLET | Freq: Every day | ORAL | Status: DC

## 2014-09-22 MED ORDER — AMLODIPINE BESYLATE 5 MG PO TABS: 5.0000 mg | ORAL_TABLET | Freq: Every day | ORAL | Status: DC

## 2014-09-22 NOTE — Progress Notes (Signed)
Patient here for follow up on his Hypertension and medication refills Patient presents with elevated blood pressure today Patient states he took his medication at 5am today

## 2014-09-22 NOTE — Patient Instructions (Signed)
Smoking Cessation Quitting smoking is important to your health and has many advantages. However, it is not always easy to quit since nicotine is a very addictive drug. Oftentimes, people try 3 times or more before being able to quit. This document explains the best ways for you to prepare to quit smoking. Quitting takes hard work and a lot of effort, but you can do it. ADVANTAGES OF QUITTING SMOKING  You will live longer, feel better, and live better.  Your body will feel the impact of quitting smoking almost immediately.  Within 20 minutes, blood pressure decreases. Your pulse returns to its normal level.  After 8 hours, carbon monoxide levels in the blood return to normal. Your oxygen level increases.  After 24 hours, the chance of having a heart attack starts to decrease. Your breath, hair, and body stop smelling like smoke.  After 48 hours, damaged nerve endings begin to recover. Your sense of taste and smell improve.  After 72 hours, the body is virtually free of nicotine. Your bronchial tubes relax and breathing becomes easier.  After 2 to 12 weeks, lungs can hold more air. Exercise becomes easier and circulation improves.  The risk of having a heart attack, stroke, cancer, or lung disease is greatly reduced.  After 1 year, the risk of coronary heart disease is cut in half.  After 5 years, the risk of stroke falls to the same as a nonsmoker.  After 10 years, the risk of lung cancer is cut in half and the risk of other cancers decreases significantly.  After 15 years, the risk of coronary heart disease drops, usually to the level of a nonsmoker.  If you are pregnant, quitting smoking will improve your chances of having a healthy baby.  The people you live with, especially any children, will be healthier.  You will have extra money to spend on things other than cigarettes. QUESTIONS TO THINK ABOUT BEFORE ATTEMPTING TO QUIT You may want to talk about your answers with your  health care provider.  Why do you want to quit?  If you tried to quit in the past, what helped and what did not?  What will be the most difficult situations for you after you quit? How will you plan to handle them?  Who can help you through the tough times? Your family? Friends? A health care provider?  What pleasures do you get from smoking? What ways can you still get pleasure if you quit? Here are some questions to ask your health care provider:  How can you help me to be successful at quitting?  What medicine do you think would be best for me and how should I take it?  What should I do if I need more help?  What is smoking withdrawal like? How can I get information on withdrawal? GET READY  Set a quit date.  Change your environment by getting rid of all cigarettes, ashtrays, matches, and lighters in your home, car, or work. Do not let people smoke in your home.  Review your past attempts to quit. Think about what worked and what did not. GET SUPPORT AND ENCOURAGEMENT You have a better chance of being successful if you have help. You can get support in many ways.  Tell your family, friends, and coworkers that you are going to quit and need their support. Ask them not to smoke around you.  Get individual, group, or telephone counseling and support. Programs are available at local hospitals and health centers. Call   your local health department for information about programs in your area.  Spiritual beliefs and practices may help some smokers quit.  Download a "quit meter" on your computer to keep track of quit statistics, such as how long you have gone without smoking, cigarettes not smoked, and money saved.  Get a self-help book about quitting smoking and staying off tobacco. LEARN NEW SKILLS AND BEHAVIORS  Distract yourself from urges to smoke. Talk to someone, go for a walk, or occupy your time with a task.  Change your normal routine. Take a different route to work.  Drink tea instead of coffee. Eat breakfast in a different place.  Reduce your stress. Take a hot bath, exercise, or read a book.  Plan something enjoyable to do every day. Reward yourself for not smoking.  Explore interactive web-based programs that specialize in helping you quit. GET MEDICINE AND USE IT CORRECTLY Medicines can help you stop smoking and decrease the urge to smoke. Combining medicine with the above behavioral methods and support can greatly increase your chances of successfully quitting smoking.  Nicotine replacement therapy helps deliver nicotine to your body without the negative effects and risks of smoking. Nicotine replacement therapy includes nicotine gum, lozenges, inhalers, nasal sprays, and skin patches. Some may be available over-the-counter and others require a prescription.  Antidepressant medicine helps people abstain from smoking, but how this works is unknown. This medicine is available by prescription.  Nicotinic receptor partial agonist medicine simulates the effect of nicotine in your brain. This medicine is available by prescription. Ask your health care provider for advice about which medicines to use and how to use them based on your health history. Your health care provider will tell you what side effects to look out for if you choose to be on a medicine or therapy. Carefully read the information on the package. Do not use any other product containing nicotine while using a nicotine replacement product.  RELAPSE OR DIFFICULT SITUATIONS Most relapses occur within the first 3 months after quitting. Do not be discouraged if you start smoking again. Remember, most people try several times before finally quitting. You may have symptoms of withdrawal because your body is used to nicotine. You may crave cigarettes, be irritable, feel very hungry, cough often, get headaches, or have difficulty concentrating. The withdrawal symptoms are only temporary. They are strongest  when you first quit, but they will go away within 10-14 days. To reduce the chances of relapse, try to:  Avoid drinking alcohol. Drinking lowers your chances of successfully quitting.  Reduce the amount of caffeine you consume. Once you quit smoking, the amount of caffeine in your body increases and can give you symptoms, such as a rapid heartbeat, sweating, and anxiety.  Avoid smokers because they can make you want to smoke.  Do not let weight gain distract you. Many smokers will gain weight when they quit, usually less than 10 pounds. Eat a healthy diet and stay active. You can always lose the weight gained after you quit.  Find ways to improve your mood other than smoking. FOR MORE INFORMATION  www.smokefree.gov  Document Released: 08/02/2001 Document Revised: 12/23/2013 Document Reviewed: 11/17/2011 ExitCare Patient Information 2015 [employer]. This information is not intended to replace advice given to you by your health care provider. Make sure you discuss any questions you have with your health care provider. DASH Eating Plan DASH stands for "Dietary Approaches to Stop Hypertension." The DASH eating plan is a healthy eating plan that has   been shown to reduce high blood pressure (hypertension). Additional health benefits may include reducing the risk of type 2 diabetes mellitus, heart disease, and stroke. The DASH eating plan may also help with weight loss. WHAT DO I NEED TO KNOW ABOUT THE DASH EATING PLAN? For the DASH eating plan, you will follow these general guidelines:  Choose foods with a percent daily value for sodium of less than 5% (as listed on the food label).  Use salt-free seasonings or herbs instead of table salt or sea salt.  Check with your health care provider or pharmacist before using salt substitutes.  Eat lower-sodium products, often labeled as "lower sodium" or "no salt added."  Eat fresh foods.  Eat more vegetables, fruits, and low-fat dairy  products.  Choose whole grains. Look for the word "whole" as the first word in the ingredient list.  Choose fish and skinless chicken or turkey more often than red meat. Limit fish, poultry, and meat to 6 oz (170 g) each day.  Limit sweets, desserts, sugars, and sugary drinks.  Choose heart-healthy fats.  Limit cheese to 1 oz (28 g) per day.  Eat more home-cooked food and less restaurant, buffet, and fast food.  Limit fried foods.  Cook foods using methods other than frying.  Limit canned vegetables. If you do use them, rinse them well to decrease the sodium.  When eating at a restaurant, ask that your food be prepared with less salt, or no salt if possible. WHAT FOODS CAN I EAT? Seek help from a dietitian for individual calorie needs. Grains Whole grain or whole wheat bread. Brown rice. Whole grain or whole wheat pasta. Quinoa, bulgur, and whole grain cereals. Low-sodium cereals. Corn or whole wheat flour tortillas. Whole grain cornbread. Whole grain crackers. Low-sodium crackers. Vegetables Fresh or frozen vegetables (raw, steamed, roasted, or grilled). Low-sodium or reduced-sodium tomato and vegetable juices. Low-sodium or reduced-sodium tomato sauce and paste. Low-sodium or reduced-sodium canned vegetables.  Fruits All fresh, canned (in natural juice), or frozen fruits. Meat and Other Protein Products Ground beef (85% or leaner), grass-fed beef, or beef trimmed of fat. Skinless chicken or turkey. Ground chicken or turkey. Pork trimmed of fat. All fish and seafood. Eggs. Dried beans, peas, or lentils. Unsalted nuts and seeds. Unsalted canned beans. Dairy Low-fat dairy products, such as skim or 1% milk, 2% or reduced-fat cheeses, low-fat ricotta or cottage cheese, or plain low-fat yogurt. Low-sodium or reduced-sodium cheeses. Fats and Oils Tub margarines without trans fats. Light or reduced-fat mayonnaise and salad dressings (reduced sodium). Avocado. Safflower, olive, or canola  oils. Natural peanut or almond butter. Other Unsalted popcorn and pretzels. The items listed above may not be a complete list of recommended foods or beverages. Contact your dietitian for more options. WHAT FOODS ARE NOT RECOMMENDED? Grains White bread. White pasta. White rice. Refined cornbread. Bagels and croissants. Crackers that contain trans fat. Vegetables Creamed or fried vegetables. Vegetables in a cheese sauce. Regular canned vegetables. Regular canned tomato sauce and paste. Regular tomato and vegetable juices. Fruits Dried fruits. Canned fruit in light or heavy syrup. Fruit juice. Meat and Other Protein Products Fatty cuts of meat. Ribs, chicken wings, bacon, sausage, bologna, salami, chitterlings, fatback, hot dogs, bratwurst, and packaged luncheon meats. Salted nuts and seeds. Canned beans with salt. Dairy Whole or 2% milk, cream, half-and-half, and cream cheese. Whole-fat or sweetened yogurt. Full-fat cheeses or blue cheese. Nondairy creamers and whipped toppings. Processed cheese, cheese spreads, or cheese curds. Condiments Onion and garlic salt,   seasoned salt, table salt, and sea salt. Canned and packaged gravies. Worcestershire sauce. Tartar sauce. Barbecue sauce. Teriyaki sauce. Soy sauce, including reduced sodium. Steak sauce. Fish sauce. Oyster sauce. Cocktail sauce. Horseradish. Ketchup and mustard. Meat flavorings and tenderizers. Bouillon cubes. Hot sauce. Tabasco sauce. Marinades. Taco seasonings. Relishes. Fats and Oils Butter, stick margarine, lard, shortening, ghee, and bacon fat. Coconut, palm kernel, or palm oils. Regular salad dressings. Other Pickles and olives. Salted popcorn and pretzels. The items listed above may not be a complete list of foods and beverages to avoid. Contact your dietitian for more information. WHERE CAN I FIND MORE INFORMATION? National Heart, Lung, and Blood Institute: www.nhlbi.nih.gov/health/health-topics/topics/dash/ Document Released:  07/28/2011 Document Revised: 12/23/2013 Document Reviewed: 06/12/2013 ExitCare Patient Information 2015 [employer]. This information is not intended to replace advice given to you by your health care provider. Make sure you discuss any questions you have with your health care provider.  

## 2014-09-22 NOTE — Progress Notes (Signed)
MRN: [id number] Name: HUXTON GLAUS  Sex: male Age: 49 y.o. DOB: Jan 07, 1966  Allergies: Review of patient's allergies indicates no known allergies.  Chief Complaint  Patient presents with  . Follow-up    HPI: Patient is 49 y.o. male who has history of hypertension, tobacco abuse comes today for followup as per patient he took his blood pressure medication early this morning, his manual blood pressure is 140/90, patient is to smoke cigarettes, I have counseled patient to quit smoking he is requesting refill on his medications, he also reported to have family history of diabetes.patient currently denies any acute symptoms.  Past Medical History  Diagnosis Date  . Hypertension     Past Surgical History  Procedure Laterality Date  . Hemorrhoid surgery    . Right knee surgery         Medication List       This list is accurate as of: 09/22/14 10:09 AM.  Always use your most recent med list.               amLODipine 5 MG tablet  Commonly known as:  NORVASC  Take 1 tablet (5 mg total) by mouth daily.     aspirin EC 81 MG EC tablet  Generic drug:  aspirin  Take 81 mg by mouth daily. Swallow whole.     hydrochlorothiazide 25 MG tablet  Commonly known as:  HYDRODIURIL  Take 1 tablet (25 mg total) by mouth daily.     lisinopril 40 MG tablet  Commonly known as:  PRINIVIL,ZESTRIL  Take 1 tablet (40 mg total) by mouth daily.     varenicline 0.5 MG X 11 & 1 MG X 42 tablet  Commonly known as:  CHANTIX STARTING MONTH PAK  Take one 0.5 mg tablet by mouth once daily for 3 days, then increase to one 0.5 mg tablet twice daily for 4 days, then increase to one 1 mg tablet twice daily.        Meds ordered this encounter  Medications  . amLODipine (NORVASC) 5 MG tablet    Sig: Take 1 tablet (5 mg total) by mouth daily.    Dispense:  30 tablet    Refill:  3  . hydrochlorothiazide (HYDRODIURIL) 25 MG tablet    Sig: Take 1 tablet (25 mg total) by mouth daily.    Dispense:  30  tablet    Refill:  3  . lisinopril (PRINIVIL,ZESTRIL) 40 MG tablet    Sig: Take 1 tablet (40 mg total) by mouth daily.    Dispense:  30 tablet    Refill:  3    Immunization History  Administered Date(s) Administered  . Influenza,inj,Quad PF,36+ Mos 05/27/2014    Family History  Problem Relation Age of Onset  . Hypertension Mother   . Diabetes Mother   . Stroke Maternal Grandmother     History  Substance Use Topics  . Smoking status: Current Every Day Smoker -- 0.50 packs/day for 30 years    Types: Cigarettes  . Smokeless tobacco: Not on file  . Alcohol Use: 3.6 oz/week    6 Cans of beer per week     Comment: everyday     Review of Systems   As noted in HPI  Filed Vitals:   09/22/14 1006  BP: 140/90  Pulse:   Temp:   Resp:     Physical Exam  Physical Exam  Constitutional: No distress.  Eyes: EOM are normal. Pupils are equal, round, and  reactive to light.  Cardiovascular: Normal rate and regular rhythm.   Pulmonary/Chest: Breath sounds normal. No respiratory distress. He has no wheezes. He has no rales.  Musculoskeletal: He exhibits no edema.    CBC    Component Value Date/Time   HGB 17.3* 05/02/2014 0925   HCT 51.0 05/02/2014 0925    CMP     Component Value Date/Time   NA 137 05/02/2014 0925   K 4.2 05/02/2014 0925   CL 104 05/02/2014 0925   GLUCOSE 109* 05/02/2014 0925   BUN 7 05/02/2014 0925   CREATININE 1.00 05/02/2014 0925    No results found for: CHOL  No components found for: HGA1C  No results found for: AST  Assessment and Plan  Essential hypertension - Plan: patient is given refill on the medications, also advise for DASH diet , will check blood chemistry  amLODipine (NORVASC) 5 MG tablet, hydrochlorothiazide (HYDRODIURIL) 25 MG tablet, lisinopril (PRINIVIL,ZESTRIL) 40 MG tablet  Family history of diabetes mellitus (DM) - Plan:Ordered Hemoglobin A1c  Nicotine dependence, uncomplicated, unspecified nicotine product  type Patient has already been prescribed nicotine patch, I have counseled patient to quit smoking  Screening - Plan:  Baseline blood work CBC with Differential/Platelet, COMPLETE METABOLIC PANEL WITH GFR, TSH, Lipid panel, Vit D  25 hydroxy (rtn osteoporosis monitoring)   Health Maintenance  -Vaccinations:  uptodate with the flu shot   Return in about 3 months (around 12/21/2014) for hypertension.  Doris CheadleADVANI, Nakyla Bracco, MD

## 2014-09-23 LAB — VITAMIN D 25 HYDROXY (VIT D DEFICIENCY, FRACTURES): Vit D, 25-Hydroxy: 7 ng/mL — ABNORMAL LOW (ref 30–100)

## 2014-09-23 LAB — TSH: TSH: 1.194 u[IU]/mL (ref 0.350–4.500)

## 2014-10-15 ENCOUNTER — Ambulatory Visit: Payer: Self-pay

## 2014-11-14 ENCOUNTER — Emergency Department (HOSPITAL_COMMUNITY): Payer: Self-pay

## 2014-11-14 ENCOUNTER — Emergency Department (HOSPITAL_COMMUNITY)
Admission: EM | Admit: 2014-11-14 | Discharge: 2014-11-14 | Disposition: A | Discharge status: Home / Self Care | Payer: Self-pay | Attending: Emergency Medicine | Admitting: Emergency Medicine

## 2014-11-14 ENCOUNTER — Encounter (HOSPITAL_COMMUNITY): Payer: Self-pay | Admitting: Emergency Medicine

## 2014-11-14 DIAGNOSIS — Z79899 Other long term (current) drug therapy: Secondary | ICD-10-CM | POA: Insufficient documentation

## 2014-11-14 DIAGNOSIS — S92355A Nondisplaced fracture of fifth metatarsal bone, left foot, initial encounter for closed fracture: Secondary | ICD-10-CM | POA: Insufficient documentation

## 2014-11-14 DIAGNOSIS — X58XXXA Exposure to other specified factors, initial encounter: Secondary | ICD-10-CM | POA: Insufficient documentation

## 2014-11-14 DIAGNOSIS — Y929 Unspecified place or not applicable: Secondary | ICD-10-CM | POA: Insufficient documentation

## 2014-11-14 DIAGNOSIS — Y9302 Activity, running: Secondary | ICD-10-CM | POA: Insufficient documentation

## 2014-11-14 DIAGNOSIS — Z720 Tobacco use: Secondary | ICD-10-CM | POA: Insufficient documentation

## 2014-11-14 DIAGNOSIS — I10 Essential (primary) hypertension: Secondary | ICD-10-CM | POA: Insufficient documentation

## 2014-11-14 DIAGNOSIS — Y998 Other external cause status: Secondary | ICD-10-CM | POA: Insufficient documentation

## 2014-11-14 DIAGNOSIS — Z7982 Long term (current) use of aspirin: Secondary | ICD-10-CM | POA: Insufficient documentation

## 2014-11-14 DIAGNOSIS — S92902A Unspecified fracture of left foot, initial encounter for closed fracture: Secondary | ICD-10-CM

## 2014-11-14 MED ORDER — HYDROCODONE-ACETAMINOPHEN 5-325 MG PO TABS: 2.0000 | ORAL_TABLET | Freq: Four times a day (QID) | ORAL | Status: DC | PRN

## 2014-11-14 NOTE — ED Provider Notes (Signed)
CSN: [id number]     Arrival date & time 11/14/14  2121 History   First MD Initiated Contact with Patient 11/14/14 2151     Chief Complaint  Patient presents with  . Foot Injury     (Consider location/radiation/quality/duration/timing/severity/associated sxs/prior Treatment) HPI Comments: Patient presents emergency room after running and stepping wrong on his left foot 3 days ago.  Since that time.  He said some swelling and pain.  He tried wrapping Coban around his ankle and upper foot, which is causing the swelling to increase.  He's tried elevating and ice without significant relief of his discomfort.  He states that he can bear weight on his heel, but when he tries to put weight on his old.  It hurts in the arch and medial aspect of his foot up to but not including the great toe  Patient is a 49 y.o. male presenting with foot injury. The history is provided by the patient.  Foot Injury Location:  Foot Time since incident:  3 days Injury: yes   Foot location:  L foot Pain details:    Quality:  Aching   Radiates to:  Does not radiate   Severity:  Mild   Onset quality:  Gradual   Duration:  3 days   Timing:  Constant   Progression:  Unchanged Chronicity:  New Dislocation: no   Foreign body present:  No foreign bodies Tetanus status:  Out of date Prior injury to area:  No Relieved by:  Nothing Exacerbated by: Patient had called and wrapped around the ankle. Ineffective treatments:  Rest Associated symptoms: decreased ROM and swelling   Associated symptoms: no fever     Past Medical History  Diagnosis Date  . Hypertension    Past Surgical History  Procedure Laterality Date  . Hemorrhoid surgery    . Right knee surgery      Family History  Problem Relation Age of Onset  . Hypertension Mother   . Diabetes Mother   . Stroke Maternal Grandmother    History  Substance Use Topics  . Smoking status: Current Every Day Smoker -- 0.50 packs/day for 30 years    Types:  Cigarettes  . Smokeless tobacco: Not on file  . Alcohol Use: 3.6 oz/week    6 Cans of beer per week     Comment: everyday     Review of Systems  Constitutional: Negative for fever.  Musculoskeletal: Negative for joint swelling.  Skin: Negative for rash and wound.  All other systems reviewed and are negative.     Allergies  Review of patient's allergies indicates no known allergies.  Home Medications   Prior to Admission medications   Medication Sig Start Date End Date Taking? Authorizing Provider  amLODipine (NORVASC) 5 MG tablet Take 1 tablet (5 mg total) by mouth daily. 09/22/14   Doris Cheadleeepak Advani, MD  aspirin (ASPIRIN EC) 81 MG EC tablet Take 81 mg by mouth daily. Swallow whole.    Historical Provider, MD  hydrochlorothiazide (HYDRODIURIL) 25 MG tablet Take 1 tablet (25 mg total) by mouth daily. 09/22/14   Doris Cheadleeepak Advani, MD  HYDROcodone-acetaminophen (NORCO/VICODIN) 5-325 MG per tablet Take 2 tablets by mouth every 6 (six) hours as needed for moderate pain. 11/14/14   Earley FavorGail Jurnie Garritano, NP  lisinopril (PRINIVIL,ZESTRIL) 40 MG tablet Take 1 tablet (40 mg total) by mouth daily. 09/22/14   Doris Cheadleeepak Advani, MD  varenicline (CHANTIX STARTING MONTH PAK) 0.5 MG X 11 & 1 MG X 42 tablet Take one 0.5  mg tablet by mouth once daily for 3 days, then increase to one 0.5 mg tablet twice daily for 4 days, then increase to one 1 mg tablet twice daily. 05/27/14   Doris Cheadle, MD   BP 116/66 mmHg  Pulse 95  Temp(Src) 98.2 F (36.8 C) (Oral)  Resp 18  Ht  (1.702 m)  Wt 165 lb (74.844 kg)  BMI 25.84 kg/m2  SpO2 96% Physical Exam  Constitutional: He appears well-nourished.  HENT:  Head: Normocephalic.  Eyes: Pupils are equal, round, and reactive to light.  Neck: Normal range of motion.  Cardiovascular: Normal rate.   Pulmonary/Chest: Effort normal.  Musculoskeletal: He exhibits edema and tenderness.       Left foot: There is tenderness and swelling. There is normal range of motion, normal capillary  refill, no crepitus, no deformity and no laceration.  Neurological: He is alert.  Skin: Skin is warm. There is erythema.  Nursing note and vitals reviewed.   ED Course  Procedures (including critical care time) Labs Review Labs Reviewed - No data to display  Imaging Review Dg Foot Complete Left  11/14/2014   CLINICAL DATA:  Foot injury post whole fall, twisted left foot  EXAM: LEFT FOOT - COMPLETE 3+ VIEW  COMPARISON:  None.  FINDINGS: Three views of the left foot submitted. There is nondisplaced fracture at the base of fifth metatarsal.  IMPRESSION: Nondisplaced fracture at the base of fifth metatarsal.   Electronically Signed   By: Natasha Mead M.D.   On: 11/14/2014 22:42     EKG Interpretation None      MDM   Final diagnoses:  Fracture of foot, left, closed, initial encounter         Earley Favor, NP 11/14/14 2300  Elwin Mocha, MD 11/14/14 [phone]

## 2014-11-14 NOTE — ED Notes (Signed)
Ortho returned page and stated they would come apply the [employer] Wrap and give him the crutches

## 2014-11-14 NOTE — ED Notes (Signed)
Pt reports twisting L ankle. Swelling noted to L foot, ace bandage in place.

## 2014-11-14 NOTE — Discharge Instructions (Signed)
Cast or Splint Care Casts and splints support injured limbs and keep bones from moving while they heal.  HOME CARE  Keep the cast or splint uncovered during the drying period.  A plaster cast can take 24 to 48 hours to dry.  A fiberglass cast will dry in less than 1 hour.  Do not rest the cast on anything harder than a pillow for 24 hours.  Do not put weight on your injured limb. Do not put pressure on the cast. Wait for your doctor's approval.  Keep the cast or splint dry.  Cover the cast or splint with a plastic bag during baths or wet weather.  If you have a cast over your chest and belly (trunk), take sponge baths until the cast is taken off.  If your cast gets wet, dry it with a towel or blow dryer. Use the cool setting on the blow dryer.  Keep your cast or splint clean. Wash a dirty cast with a damp cloth.  Do not put any objects under your cast or splint.  Do not scratch the skin under the cast with an object. If itching is a problem, use a blow dryer on a cool setting over the itchy area.  Do not trim or cut your cast.  Do not take out the padding from inside your cast.  Exercise your joints near the cast as told by your doctor.  Raise (elevate) your injured limb on 1 or 2 pillows for the first 1 to 3 days. GET HELP IF:  Your cast or splint cracks.  Your cast or splint is too tight or too loose.  You itch badly under the cast.  Your cast gets wet or has a soft spot.  You have a bad smell coming from the cast.  You get an object stuck under the cast.  Your skin around the cast becomes red or sore.  You have new or more pain after the cast is put on. GET HELP RIGHT AWAY IF:  You have fluid leaking through the cast.  You cannot move your fingers or toes.  Your fingers or toes turn blue or white or are cool, painful, or puffy (swollen).  You have tingling or lose feeling (numbness) around the injured area.  You have bad pain or pressure under the  cast.  You have trouble breathing or have shortness of breath.  You have chest pain. Document Released: 12/08/2010 Document Revised: 04/10/2013 Document Reviewed: 02/14/2013 [facility] Patient Information 2015 [place], [place]. This information is not intended to replace advice given to you by your health care provider. Make sure you discuss any questions you have with your health care provider. You have a fracture at the base of the bone in your foot on the lateral side(under the fifth toe) monitoring of the healing will need to take place.  Please make an appointment with Dr. Roda ShuttersXu for next week. You're to be nonweightbearing and have been by provided with crutches as well as given a prescription for pain medication

## 2014-12-22 ENCOUNTER — Emergency Department (HOSPITAL_COMMUNITY)
Admission: EM | Admit: 2014-12-22 | Discharge: 2014-12-23 | Disposition: A | Discharge status: Home / Self Care | Payer: Self-pay | Attending: Emergency Medicine | Admitting: Emergency Medicine

## 2014-12-22 ENCOUNTER — Encounter (HOSPITAL_COMMUNITY): Payer: Self-pay

## 2014-12-22 ENCOUNTER — Emergency Department (HOSPITAL_COMMUNITY): Payer: Self-pay

## 2014-12-22 DIAGNOSIS — S40011A Contusion of right shoulder, initial encounter: Secondary | ICD-10-CM | POA: Insufficient documentation

## 2014-12-22 DIAGNOSIS — F1020 Alcohol dependence, uncomplicated: Secondary | ICD-10-CM | POA: Insufficient documentation

## 2014-12-22 DIAGNOSIS — Z720 Tobacco use: Secondary | ICD-10-CM | POA: Insufficient documentation

## 2014-12-22 DIAGNOSIS — S99912A Unspecified injury of left ankle, initial encounter: Secondary | ICD-10-CM | POA: Insufficient documentation

## 2014-12-22 DIAGNOSIS — Y9389 Activity, other specified: Secondary | ICD-10-CM | POA: Insufficient documentation

## 2014-12-22 DIAGNOSIS — Z79899 Other long term (current) drug therapy: Secondary | ICD-10-CM | POA: Insufficient documentation

## 2014-12-22 DIAGNOSIS — X58XXXA Exposure to other specified factors, initial encounter: Secondary | ICD-10-CM | POA: Insufficient documentation

## 2014-12-22 DIAGNOSIS — R55 Syncope and collapse: Secondary | ICD-10-CM | POA: Insufficient documentation

## 2014-12-22 DIAGNOSIS — Y998 Other external cause status: Secondary | ICD-10-CM | POA: Insufficient documentation

## 2014-12-22 DIAGNOSIS — S301XXA Contusion of abdominal wall, initial encounter: Secondary | ICD-10-CM | POA: Insufficient documentation

## 2014-12-22 DIAGNOSIS — Y9289 Other specified places as the place of occurrence of the external cause: Secondary | ICD-10-CM | POA: Insufficient documentation

## 2014-12-22 DIAGNOSIS — I10 Essential (primary) hypertension: Secondary | ICD-10-CM | POA: Insufficient documentation

## 2014-12-22 DIAGNOSIS — S0083XA Contusion of other part of head, initial encounter: Secondary | ICD-10-CM | POA: Insufficient documentation

## 2014-12-22 LAB — I-STAT TROPONIN, ED: TROPONIN I, POC: 0 ng/mL (ref 0.00–0.08)

## 2014-12-22 LAB — BASIC METABOLIC PANEL
Anion gap: 11 (ref 5–15)
BUN: 5 mg/dL — ABNORMAL LOW (ref 6–20)
CO2: 25 mmol/L (ref 22–32)
Calcium: 9.2 mg/dL (ref 8.9–10.3)
Chloride: 94 mmol/L — ABNORMAL LOW (ref 101–111)
Creatinine, Ser: 0.93 mg/dL (ref 0.61–1.24)
GFR calc Af Amer: >60 mL/min (ref >60)
GLUCOSE: 87 mg/dL (ref 70–99)
POTASSIUM: 3.8 mmol/L (ref 3.5–5.1)
SODIUM: 130 mmol/L — AB (ref 135–145)

## 2014-12-22 LAB — CBC
HCT: 39.9 % (ref 39.0–52.0)
Hemoglobin: 14.4 g/dL (ref 13.0–17.0)
MCH: 33.7 pg (ref 26.0–34.0)
MCHC: 36.1 g/dL — ABNORMAL HIGH (ref 30.0–36.0)
MCV: 93.4 fL (ref 78.0–100.0)
Platelets: 163 10*3/uL (ref 150–400)
RBC: 4.27 MIL/uL (ref 4.22–5.81)
RDW: 13.8 % (ref 11.5–15.5)
WBC: 7 10*3/uL (ref 4.0–10.5)

## 2014-12-22 LAB — TROPONIN I: Troponin I: <0.03 ng/mL (ref <0.031)

## 2014-12-22 MED ORDER — LORAZEPAM 1 MG PO TABS
1.0000 mg | ORAL_TABLET | Freq: Once | ORAL | Status: AC
  Administered 2014-12-22: 1 mg via ORAL
  Filled 2014-12-22: qty 1

## 2014-12-22 NOTE — ED Provider Notes (Signed)
CSN: [id number]     Arrival date & time 12/22/14  1641 History   First MD Initiated Contact with Patient 12/22/14 2211     Chief Complaint  Patient presents with  . Loss of Consciousness   (Consider location/radiation/quality/duration/timing/severity/associated sxs/prior Treatment)  Patient is a 49 y.o. male presenting with syncope. The history is provided by the patient. No language interpreter was used.  Loss of Consciousness Episode history:  Multiple Most recent episode:  Today Timing:  Intermittent Progression:  Unchanged Chronicity:  Recurrent Witnessed: yes   Relieved by:  Nothing Worsened by:  Nothing tried Ineffective treatments:  None tried Associated symptoms: no anxiety, no chest pain, no confusion, no difficulty breathing, no dizziness, no fever, no focal weakness, no headaches, no malaise/fatigue, no nausea, no palpitations, no shortness of breath, no vomiting and no weakness   Risk factors: no coronary artery disease     Past Medical History  Diagnosis Date  . Hypertension    Past Surgical History  Procedure Laterality Date  . Hemorrhoid surgery    . Right knee surgery      Family History  Problem Relation Age of Onset  . Hypertension Mother   . Diabetes Mother   . Stroke Maternal Grandmother    History  Substance Use Topics  . Smoking status: Current Every Day Smoker -- 0.50 packs/day for 30 years    Types: Cigarettes  . Smokeless tobacco: Not on file  . Alcohol Use: 3.6 oz/week    6 Cans of beer per week     Comment: everyday     Review of Systems  Constitutional: Negative for fever, malaise/fatigue and fatigue.  Respiratory: Negative for chest tightness, shortness of breath and wheezing.   Cardiovascular: Positive for syncope. Negative for chest pain and palpitations.  Gastrointestinal: Negative for nausea, vomiting, abdominal pain and diarrhea.  Genitourinary: Negative for hematuria.  Neurological: Positive for syncope. Negative for dizziness,  focal weakness, facial asymmetry, speech difficulty, weakness, light-headedness and headaches.  Psychiatric/Behavioral: Negative for confusion.  All other systems reviewed and are negative.     Allergies  Review of patient's allergies indicates no known allergies.  Home Medications   Prior to Admission medications   Medication Sig Start Date End Date Taking? Authorizing Provider  amLODipine (NORVASC) 5 MG tablet Take 1 tablet (5 mg total) by mouth daily. 09/22/14  Yes Doris Cheadleeepak Advani, MD  aspirin (ASPIRIN EC) 81 MG EC tablet Take 81 mg by mouth daily as needed for pain. Swallow whole.   Yes Historical Provider, MD  hydrochlorothiazide (HYDRODIURIL) 25 MG tablet Take 1 tablet (25 mg total) by mouth daily. 09/22/14  Yes Doris Cheadleeepak Advani, MD  HYDROcodone-acetaminophen (NORCO/VICODIN) 5-325 MG per tablet Take 2 tablets by mouth every 6 (six) hours as needed for moderate pain. 11/14/14  Yes Earley FavorGail Schulz, NP  lisinopril (PRINIVIL,ZESTRIL) 40 MG tablet Take 1 tablet (40 mg total) by mouth daily. 09/22/14  Yes Doris Cheadleeepak Advani, MD  varenicline (CHANTIX STARTING MONTH PAK) 0.5 MG X 11 & 1 MG X 42 tablet Take one 0.5 mg tablet by mouth once daily for 3 days, then increase to one 0.5 mg tablet twice daily for 4 days, then increase to one 1 mg tablet twice daily. Patient not taking: Reported on 12/22/2014 05/27/14   Doris Cheadleeepak Advani, MD   BP 117/71 mmHg  Pulse 91  Temp(Src) 98.5 F (36.9 C) (Oral)  Resp 12  Ht 5\' 7"  (1.702 m)  Wt 171 lb (77.565 kg)  BMI 26.78 kg/m2  SpO2  98%   Physical Exam  Constitutional: He is oriented to person, place, and time. Vital signs are normal. He appears well-developed and well-nourished. He does not appear ill. No distress.  HENT:  Head: Normocephalic and atraumatic.  Nose: Nose normal.  Mouth/Throat: Oropharynx is clear and moist. No oropharyngeal exudate.  Eyes: EOM are normal. Pupils are equal, round, and reactive to light.  Neck: Normal range of motion. Neck supple.   Cardiovascular: Normal rate, regular rhythm, normal heart sounds and intact distal pulses.   No murmur heard. Pulmonary/Chest: Effort normal and breath sounds normal. No respiratory distress. He has no wheezes. He exhibits no tenderness.  Abdominal: Soft. He exhibits no distension. There is no tenderness. There is no guarding.  Musculoskeletal: Normal range of motion. He exhibits no tenderness.  Left ankle in walking boot, mildly tender to palpation of lateral malleolus, but overall benign exam of LLE.   Mild bruising noted to right forehead, right shoulder, and left flank. Nontender.  No chest tenderness to palpation, stable, and no crepitus.  No pelvis tenderness to palpation and stable.  Moving all 4 extremities normally.  No C/T/L spine midline tenderness, no step offs.    Neurological: He is alert and oriented to person, place, and time. No cranial nerve deficit. Coordination normal.  Skin: Skin is warm and dry. He is not diaphoretic. No pallor.  Psychiatric: He has a normal mood and affect. His behavior is normal. Judgment and thought content normal.  Nursing note and vitals reviewed.   ED Course  Procedures (including critical care time) Labs Review Labs Reviewed  CBC - Abnormal; Notable for the following:    MCHC 36.1 (*)    All other components within normal limits  BASIC METABOLIC PANEL - Abnormal; Notable for the following:    Sodium 130 (*)    Chloride 94 (*)    BUN 5 (*)    All other components within normal limits  TROPONIN I  Rosezena Sensor, ED   Imaging Review Dg Chest 2 View  12/22/2014   CLINICAL DATA:  Productive cough for 3 days. Loss of consciousness today.  EXAM: CHEST  2 VIEW  COMPARISON:  None.  FINDINGS: A nodular density measuring 0.8 cm in diameter projects over the anterior arc of the right fifth rib. No consolidative process, pneumothorax or effusion is identified. There appears to be some bullous change in the apices. Heart size is normal.  IMPRESSION:  Nodular opacity projecting over the right fifth rib may be a bone island but cannot be definitively characterized. Nonemergent chest CT without contrast could be used for further evaluation.  Bullous change in the apices compatible with emphysema.   Electronically Signed   By: Drusilla Kanner M.D.   On: 12/22/2014 18:36     EKG Interpretation   Date/Time:  Monday Dec 22 2014 17:12:15 EDT Ventricular Rate:  97 PR Interval:  138 QRS Duration: 92 QT Interval:  346 QTC Calculation: 439 R Axis:   50 Text Interpretation:  Normal sinus rhythm Normal ECG No previous tracing  Confirmed by Anitra Lauth  MD, Alphonzo Lemmings ([id number]) on 12/22/2014 10:58:19 PM      MDM   Final diagnoses:  Syncope and collapse  Chronic alcohol dependence, continuous   Pt is a 49 yo M with hx of HTN and EtOH abuse who presents today complaining of multiple syncopal episodes in the past few months.   Pt/wife report that he has had at least 10 episodes of full syncope and collapse since Feb (  3 months).  Has had episodes of syncope that do no follow any specific pattern. Wife is able to describe multiple syncopal episodes associated with various things.  No clear pattern noted- occurs in the AM and PM, not always when actively drinking (but often so), not associated with position changes or vagal maneuvers, etc.  Wife/pt able to describe episodes associated with coughing, positional changes, once when he was standing for a prolonged time, once when he was ambulating in the front yard, once when walking in the neighborhood, once in the kitchen, once at his friends house, once while walking on the sidewalk (has a left ankle sprain from this episode), and once today while in the parking lot, etc.  All episodes have a prodrome of lightheadedness and blackening of vision.  Never occurred without prodrome first.  No chest pain associated.  + LOC for several seconds before returning back to baseline.  No abnormal movements noted.  Has sustained  bruises on forehead and left flank, as well as sprained left ankle during these syncopal events.  He drinks ~6 beers daily and endorses sx of anxiety, tremors, and restlessness if he doesn't drink.  + tobacco use but denies illicit drugs.  No personal or family hx of arrhythmias or sudden death.  Patient appears anxious and is somewhat aggressive, so was offered ativan and he accepted.  Likely associated with some EtOH withdrawal sx.  No tremor, denies hallucinations, no other sx.   EKG benign.  No signs of ischemia or arrhythmia.   CXR with mild emphysema, no opacities, normal cardiac silhouette, stable mediastinum size.  Labs ok with 2 x negative trops, no anemia, mildly hyponatremic (trending down from 133 2/1 to 130 today).      He was encouraged to stay overnight for full syncopal work up, but patient declined.  Stated he would not stay overnight in the hospital no matter what.  He was educated on the risks of leaving and voiced understanding and agreement with these risks.  Advised on ED return precautions and stressed the importance of keeping close follow up so that he can be fully evaluated for these recurrent syncopal episodes.  He was given a list of resources if he decides he wishes to stop drinking in the future.  Provided with the phone number to the wellness clinic as he does not have a PCP.  All questions were answered prior to dc.   Patient was seen with ED Attending, Dr. Lawernce Keas, MD    Lenell Antu, MD 12/24/14 1610  Gwyneth Sprout, MD 12/25/14 2138

## 2014-12-22 NOTE — ED Notes (Signed)
Since February, pt. Has been having intermittent syncopal episode.  He describes them as coughing real hard and he begins to get dizzy , blurry vision and then out.  Today he had one in a parking lot and was witnessed syncopal episode lasting aqpproximately 20 seconds and he has a abrasion and hematoma to the top of his lt. Head.  GCS 15, Skin is warm and dry  Pt. Denies any chest pain or sob. Pt. Does have a headache and dizziness Denies any n/v .    ECG completed in ED, NSR.

## 2014-12-23 NOTE — Discharge Instructions (Signed)
°Emergency Department Resource Guide °1) Find a Doctor and Pay Out of Pocket °Although you won't have to find out who is covered by your insurance plan, it is a good idea to ask around and get recommendations. You will then need to call the office and see if the doctor you have chosen will accept you as a new patient and what types of options they offer for patients who are self-pay. Some doctors offer discounts or will set up payment plans for their patients who do not have insurance, but you will need to ask so you aren't surprised when you get to your appointment. ° °2) Contact Your Local Health Department °Not all health departments have doctors that can see patients for sick visits, but many do, so it is worth a call to see if yours does. If you don't know where your local health department is, you can check in your phone book. The CDC also has a tool to help you locate your state's health department, and many state websites also have listings of all of their local health departments. ° °3) Find a Walk-in Clinic °If your illness is not likely to be very severe or complicated, you may want to try a walk in clinic. These are popping up all over the country in pharmacies, drugstores, and shopping centers. They're usually staffed by nurse practitioners or physician assistants that have been trained to treat common illnesses and complaints. They're usually fairly quick and inexpensive. However, if you have serious medical issues or chronic medical problems, these are probably not your best option. ° °No Primary Care Doctor: °- Call [facility] at  832-8000 - they can help you locate a primary care doctor that  accepts your insurance, provides certain services, etc. °- Physician Referral Service- 1-800-533-3463 ° °Chronic Pain Problems: °Organization         Address  Phone   Notes  °[facility]  (336) 297-2271 Patients need to be referred by their primary care doctor.  ° °Medication  Assistance: °Organization         Address  Phone   Notes  °[place] [facility] [address] °[place], [place] [address] (336) 641-8030 --Must be a resident of [place] °-- Must have NO insurance coverage whatsoever (no Medicaid/ Medicare, etc.) °-- The pt. MUST have a primary care doctor that directs their care regularly and follows them in the community °  °[facility]  (866) 331-1348   °[employer]  (888) 892-1162   ° °Agencies that provide inexpensive medical care: °Organization         Address  Phone   Notes  °[facility]  (336) 832-8035   °[facility]    (336) 832-7272   °[facility] [address] °[place], [place] [address] (336) 832-4777   °[facility] [address], °[place] (336) 271-4999   °[facility]    (336) 373-0678   °[facility]    (336) 272-1050   °[facility] ° [address], [place] Phone:  (336) 832-4444, Fax:  (336) 832-4440 Hours of Operation:  9 am - 6 pm, M-F.  Also accepts Medicaid/Medicare and self-pay.  °[facility] ° [address], [place] Phone: (336) 832-3150, Fax: (336) 832-3151. Hours of Operation:  8:30 am - 5:30 pm, M-F.  Also accepts Medicaid and self-pay.  °[facility] [place] [address], [place] Phone: (336) 878-6027   °[facility] [address], [place], [place] (336)723-1848, Ext. 123 Mondays & Thursdays: 7-9 AM.  First 15 patients are seen on a first come, first serve basis. °  ° °Medicaid-accepting [place] Providers: ° °Organization         Address  Phone   Notes  °[facility] [address], [place] (336) 641-2100 Also accepts self-pay patients.  °[facility] [address], [place] ° (336) 856-9996   °[facility] [address], [place]  (336) 288-8857   °[facility] [address] [place] [address], [place] (336) 299-7000   °Veita Bland [address], [place]  ° (336) 373-1557 Only accepts [place] Access Medicaid patients after they have their name applied to their card.  ° °Self-Pay (no insurance) in [place]: ° °Organization         Address  Phone   Notes  °Sickle Cell Patients, [facility] [address], [place] (336) 832-1970   °[facility] [address], [place] (336) 832-4400   °[facility] ° [address], [place] (336) 992-4800   °[facility]/Dr. Osei-Bonsu ° [address] [place] [address], [place] or [address], [place] (336) 841-8500 Phone number for both [place] and [place] locations is the same.  °[facility] [address], [place] (336) 299-0000   °[facility] [address] [place] [address], [place] or [address] (336) 852-7530 °(336) 878-2260   °[facility] [address], [place] (336) 350-1642, phone; (336) 294-5005, fax Sees patients 1st and 3rd Saturday of every month.  Must not qualify for public or private insurance (i.e. Medicaid, Medicare, [place] Health Choice, Veterans' Benefits) • Household income should be no more than 200% of the poverty level •The clinic cannot treat you if you are pregnant or think you are pregnant • Sexually transmitted diseases are not treated at the clinic.  ° ° °Dental Care: °Organization         Address  Phone  Notes  °[place] [facility] [address], [place] (336) 641-6152 Accepts children up to age 21 who are enrolled in Medicaid or [place] Health Choice; pregnant women with a Medicaid card; and children who have applied for Medicaid or [place] Health Choice, but were declined, whose parents can pay a reduced fee at time of service.  °[place]  [facility] [place]  [address], [place] (336) 641-7733 Accepts children up to age 21 who are enrolled in Medicaid or  Health Choice; pregnant women with a Medicaid card; and children who have applied for Medicaid or  Health Choice, but were declined, whose parents can pay a reduced fee at time of service.  °[facility] ° [address],  (336) 641-4533 Patients are seen by appointment only. Walk-ins are not accepted. [facility] will see patients 18 years of age and older. °Monday - Tuesday (8am-5pm) °Most Wednesdays (8:30-5pm) °$30 per visit, cash only  °[facility] ° [address], [place] (336) 641-4533 Patients are seen by appointment only. Walk-ins are not accepted. [facility] will see patients 18 years of age and older. °One   Wednesday Evening (Monthly: Volunteer Based).  $30 per visit, cash only  °[facility]  (919) 537-3737 for adults; Children under age 4, call Graduate Pediatric Dentistry at (919) [phone]. Children aged 4-14, please call (919) [phone] to request a pediatric application. ° Dental services are provided in all areas of dental care including fillings, crowns and bridges, complete and partial dentures, implants, gum treatment, root canals, and extractions. Preventive care is also provided. Treatment is provided to both adults and children. °Patients are selected via a lottery and there is often a waiting list. °  °[facility] [address], °[place] ° (336) 763-8833 [email or web address] °  °[facility] [address], [place], [place] (336)723-1848, Ext. 123 Second and Fourth Thursday of each month, opens at 6:30 AM; Clinic ends at 9 AM.  Patients are seen on a first-come first-served basis, and a limited number are seen during each clinic.  ° °[facility] ° [address], [place], [place] (336) 723-7904    Eligibility Requirements °You must have lived in [place], [place], or [place] counties for at least the last three months. °  You cannot be eligible for state or federal sponsored healthcare insurance, including Veterans Administration, Medicaid, or Medicare. °  You generally cannot be eligible for healthcare insurance through your employer.  °  How to apply: °Eligibility screenings are held every Tuesday and Wednesday afternoon from 1:00 pm until 4:00 pm. You do not need an appointment for the interview!  °[facility] [address], [place], [place] 336-631-2330   °[facility] [facility]  336-342-8273   °[facility]  336-703-3100   °[facility]  336-570-6415   ° °Behavioral Health Resources in the Community: °Intensive Outpatient Programs °Organization         Address  Phone  Notes  °[facility] [address], [place], [place] 336-878-6098   °[facility] [address], [place], [place] 336-832-9800   °[facility] [address], [place], [place] ° 336-882-2125   °[facility] [address],  °[place], [place] 1-800-853-5163 or 336-641-4981   °Substance Abuse Resources °Organization         Address  Phone  Notes  °[facility]  336-882-2125   °[facility]  336-784-9470   °[facility]  336-285-9073   °[facility]  336-845-3988   °Residential & Outpatient Substance Abuse Program  1-800-659-3381   °Psychological Services °Organization         Address  Phone  Notes  °[facility]  336- 832-9600   °[facility]  336- 378-7881   °[facility] [address], [place] 1-800-853-5163 or 336-641-4981   ° °Mobile Crisis Teams °Organization         Address  Phone  Notes  °[facility], Mobile Crisis Care Unit  1-877-626-1772   °Assertive °[facility] ° [address]  [place], [place] 336-834-9664   °Sharon DeEsch [address] °[place] 336-554-5454   ° °Self-Help/Support Groups °Organization         Address  Phone             Notes  °[facility] [place] - variety of support groups  336- 373-1402 Call for more information  °Narcotics Anonymous (NA), [facility] [address], °[place]   2 meetings at this location  ° °  Residential Treatment Programs °Organization         Address  Phone  Notes  °[facility] [address],    °[place]  1-866-801-8205   °[facility] ° [address], [place], [place] 704-293-8524   °[facility] [address], [place] 336-845-3988 Admissions: 8am-3pm M-F  °[facility] [address]    °[place], [place] 336-841-1104   °[facility] [address], [place], [place] 336-379-7146   °[facility] [address]  °[place], [place] 336-285-9073   °[facility] Intensive Outpatient [address], [place], [place] 336-852-3033   °[facility] [address]  °[place], [place] 1-877-615-2722 or 336-784-9470   °[facility] [address] [place], [place] 336-227-7417 Accepts Medicaid  °[facility] [address]  °[place] 1-800-659-3381 Substance Abuse/Addiction Treatment  ° °[place] Behavioral Health Resources °Organization         Address  Phone  Notes  °[facility]  (888) 581-9988   °Julie Brannon, PhD [address] [place], [place]   (336) 349-5553 or (336) 951-0000   °[facility]   [address] °[place], [place] (336) 349-4454   °[facility] [address], [place], [place] (336) 342-8316 Insurance/Medicaid/sponsorship through [facility]  °[facility] [address]                                    [place], [place] (336) 342-8316 Therapy/tele-psych/case    °[facility] [address]  ° [place], [place] (336) 349-2233    °Dr. Arfeen  (336) 349-4544   °[facility] [place]  [employer] [place] [facility] 1) [address], [place] °2) [address], [place] °3)  [address], [place] (336) 349-3220 °(336) 342-7768 ° °(336) 342-8140   °[place] Child Abuse [facility] (336) 342-1394 or (336) 342-3537 (After Hours)    ° ° °

## 2014-12-26 ENCOUNTER — Ambulatory Visit: Payer: Self-pay

## 2014-12-30 ENCOUNTER — Ambulatory Visit: Payer: Self-pay | Admitting: Internal Medicine

## 2015-01-05 ENCOUNTER — Ambulatory Visit: Payer: Self-pay | Admitting: Internal Medicine

## 2015-01-12 ENCOUNTER — Ambulatory Visit: Payer: Self-pay | Admitting: Internal Medicine

## 2015-01-15 ENCOUNTER — Ambulatory Visit: Payer: Self-pay | Admitting: Internal Medicine

## 2015-01-20 ENCOUNTER — Ambulatory Visit: Payer: Self-pay | Admitting: Internal Medicine

## 2015-01-26 ENCOUNTER — Ambulatory Visit: Payer: Self-pay | Attending: Internal Medicine | Admitting: Internal Medicine

## 2015-01-26 ENCOUNTER — Encounter: Payer: Self-pay | Admitting: Internal Medicine

## 2015-01-26 VITALS — BP 136/86 | HR 106 | Temp 98.0°F | Resp 16 | Wt 164.0 lb

## 2015-01-26 DIAGNOSIS — F1099 Alcohol use, unspecified with unspecified alcohol-induced disorder: Secondary | ICD-10-CM

## 2015-01-26 DIAGNOSIS — F17200 Nicotine dependence, unspecified, uncomplicated: Secondary | ICD-10-CM

## 2015-01-26 DIAGNOSIS — Z7289 Other problems related to lifestyle: Secondary | ICD-10-CM

## 2015-01-26 DIAGNOSIS — Z789 Other specified health status: Secondary | ICD-10-CM

## 2015-01-26 DIAGNOSIS — I10 Essential (primary) hypertension: Secondary | ICD-10-CM

## 2015-01-26 MED ORDER — AMLODIPINE BESYLATE 5 MG PO TABS: 5.0000 mg | ORAL_TABLET | Freq: Every day | ORAL | Status: DC

## 2015-01-26 MED ORDER — LISINOPRIL 40 MG PO TABS: 40.0000 mg | ORAL_TABLET | Freq: Every day | ORAL | Status: DC

## 2015-01-26 MED ORDER — BUPROPION HCL 100 MG PO TABS: 100.0000 mg | ORAL_TABLET | Freq: Two times a day (BID) | ORAL | Status: DC

## 2015-01-26 MED ORDER — HYDROCHLOROTHIAZIDE 25 MG PO TABS: 25.0000 mg | ORAL_TABLET | Freq: Every day | ORAL | Status: DC

## 2015-01-26 NOTE — Progress Notes (Signed)
Patient here for follow up on his blood pressure and for medication refills 

## 2015-01-26 NOTE — Progress Notes (Signed)
MRN: [id number] Name: Jimmy Zimmerman  Sex: male Age: 48 y.o. DOB: 07/26/1966  Allergies: Review of patient's allergies indicates no known allergies.  Chief Complaint  Patient presents with  . Follow-up    HPI: Patient is 49 y.o. male who has history of hypertension, tobacco abuse, alcohol abuse, last month patient went to the emergency room with symptoms of syncope, EMR reviewed patient had EKG and troponin done which was negative, he was advised to cut down drinking alcohol, as per patient is trying to do that denies any more syncopal episodes he still smokes cigarettes in the past he was prescribed Chantix which as per patient he did not like, he would like to try something else. Patient currently denies any chest pain shortness of breath or any palpitations headache dizziness numbness weakness.  Past Medical History  Diagnosis Date  . Hypertension     Past Surgical History  Procedure Laterality Date  . Hemorrhoid surgery    . Right knee surgery         Medication List       This list is accurate as of: 01/26/15 11:15 AM.  Always use your most recent med list.               amLODipine 5 MG tablet  Commonly known as:  NORVASC  Take 1 tablet (5 mg total) by mouth daily.     aspirin EC 81 MG EC tablet  Generic drug:  aspirin  Take 81 mg by mouth daily as needed for pain. Swallow whole.     buPROPion 100 MG tablet  Commonly known as:  WELLBUTRIN  Take 1 tablet (100 mg total) by mouth 2 (two) times daily.     hydrochlorothiazide 25 MG tablet  Commonly known as:  HYDRODIURIL  Take 1 tablet (25 mg total) by mouth daily.     HYDROcodone-acetaminophen 5-325 MG per tablet  Commonly known as:  NORCO/VICODIN  Take 2 tablets by mouth every 6 (six) hours as needed for moderate pain.     lisinopril 40 MG tablet  Commonly known as:  PRINIVIL,ZESTRIL  Take 1 tablet (40 mg total) by mouth daily.     varenicline 0.5 MG X 11 & 1 MG X 42 tablet  Commonly known as:  CHANTIX  STARTING MONTH PAK  Take one 0.5 mg tablet by mouth once daily for 3 days, then increase to one 0.5 mg tablet twice daily for 4 days, then increase to one 1 mg tablet twice daily.        Meds ordered this encounter  Medications  . amLODipine (NORVASC) 5 MG tablet    Sig: Take 1 tablet (5 mg total) by mouth daily.    Dispense:  30 tablet    Refill:  3  . hydrochlorothiazide (HYDRODIURIL) 25 MG tablet    Sig: Take 1 tablet (25 mg total) by mouth daily.    Dispense:  30 tablet    Refill:  3  . lisinopril (PRINIVIL,ZESTRIL) 40 MG tablet    Sig: Take 1 tablet (40 mg total) by mouth daily.    Dispense:  30 tablet    Refill:  3  . buPROPion (WELLBUTRIN) 100 MG tablet    Sig: Take 1 tablet (100 mg total) by mouth 2 (two) times daily.    Dispense:  60 tablet    Refill:  3    Immunization History  Administered Date(s) Administered  . Influenza,inj,Quad PF,36+ Mos 05/27/2014    Family History  Problem Relation Age of Onset  . Hypertension Mother   . Diabetes Mother   . Stroke Maternal Grandmother     History  Substance Use Topics  . Smoking status: Current Every Day Smoker -- 0.50 packs/day for 30 years    Types: Cigarettes  . Smokeless tobacco: Not on file  . Alcohol Use: 3.6 oz/week    6 Cans of beer per week     Comment: everyday     Review of Systems   As noted in HPI  Filed Vitals:   01/26/15 1038  BP: 136/86  Pulse: 106  Temp: 98 F (36.7 C)  Resp: 16    Physical Exam  Physical Exam  Constitutional: No distress.  Eyes: EOM are normal. Pupils are equal, round, and reactive to light.  Cardiovascular: Normal rate and regular rhythm.   Pulmonary/Chest: Breath sounds normal. No respiratory distress. He has no wheezes. He has no rales.  Musculoskeletal: He exhibits no edema.    CBC    Component Value Date/Time   WBC 7.0 12/22/2014 1734   RBC 4.27 12/22/2014 1734   HGB 14.4 12/22/2014 1734   HCT 39.9 12/22/2014 1734   PLT 163 12/22/2014 1734   MCV  93.4 12/22/2014 1734   LYMPHSABS 1.1 09/22/2014 1021   MONOABS 0.6 09/22/2014 1021   EOSABS 0.0 09/22/2014 1021   BASOSABS 0.0 09/22/2014 1021    CMP     Component Value Date/Time   NA 130* 12/22/2014 1734   K 3.8 12/22/2014 1734   CL 94* 12/22/2014 1734   CO2 25 12/22/2014 1734   GLUCOSE 87 12/22/2014 1734   BUN 5* 12/22/2014 1734   CREATININE 0.93 12/22/2014 1734   CREATININE 1.03 09/22/2014 1021   CALCIUM 9.2 12/22/2014 1734   PROT 7.8 09/22/2014 1021   ALBUMIN 5.1 09/22/2014 1021   AST 35 09/22/2014 1021   ALT 40 09/22/2014 1021   ALKPHOS 84 09/22/2014 1021   BILITOT 0.9 09/22/2014 1021   GFRNONAA >60 12/22/2014 1734   GFRNONAA 85 09/22/2014 1021   GFRAA >60 12/22/2014 1734   GFRAA >89 09/22/2014 1021    Lab Results  Component Value Date/Time   CHOL 220* 09/22/2014 10:21 AM    Lab Results  Component Value Date/Time   HGBA1C 5.5 09/22/2014 10:21 AM    Lab Results  Component Value Date/Time   AST 35 09/22/2014 10:21 AM    Assessment and Plan  Essential hypertension - Plan: blood pressure is well controlled, continue current meds amLODipine (NORVASC) 5 MG tablet, hydrochlorothiazide (HYDRODIURIL) 25 MG tablet, lisinopril (PRINIVIL,ZESTRIL) 40 MG tablet, recheck blood chemistry on the following visit.  Nicotine dependence, uncomplicated, unspecified nicotine product type - Plan: buPROPion (WELLBUTRIN) 100 MG tablet  Alcohol use Patient is advised to abstain from alcohol.   Return in about 3 months (around 04/28/2015), or if symptoms worsen or fail to improve.   This note has been created with Education officer, environmentalDragon speech recognition software and smart phrase technology. Any transcriptional errors are unintentional.    Doris CheadleADVANI, Xayvier Vallez, MD

## 2015-02-18 ENCOUNTER — Ambulatory Visit: Payer: Self-pay

## 2015-03-12 ENCOUNTER — Encounter (HOSPITAL_COMMUNITY): Payer: Self-pay | Admitting: Emergency Medicine

## 2015-03-12 ENCOUNTER — Emergency Department (HOSPITAL_COMMUNITY): Payer: Self-pay

## 2015-03-12 ENCOUNTER — Emergency Department (HOSPITAL_COMMUNITY)
Admission: EM | Admit: 2015-03-12 | Discharge: 2015-03-13 | Disposition: A | Discharge status: Home / Self Care | Payer: Self-pay | Attending: Emergency Medicine | Admitting: Emergency Medicine

## 2015-03-12 DIAGNOSIS — F1410 Cocaine abuse, uncomplicated: Secondary | ICD-10-CM | POA: Insufficient documentation

## 2015-03-12 DIAGNOSIS — Z23 Encounter for immunization: Secondary | ICD-10-CM | POA: Insufficient documentation

## 2015-03-12 DIAGNOSIS — W19XXXA Unspecified fall, initial encounter: Secondary | ICD-10-CM

## 2015-03-12 DIAGNOSIS — F1310 Sedative, hypnotic or anxiolytic abuse, uncomplicated: Secondary | ICD-10-CM | POA: Insufficient documentation

## 2015-03-12 DIAGNOSIS — Z79899 Other long term (current) drug therapy: Secondary | ICD-10-CM | POA: Insufficient documentation

## 2015-03-12 DIAGNOSIS — S0003XA Contusion of scalp, initial encounter: Secondary | ICD-10-CM | POA: Insufficient documentation

## 2015-03-12 DIAGNOSIS — Y998 Other external cause status: Secondary | ICD-10-CM | POA: Insufficient documentation

## 2015-03-12 DIAGNOSIS — Y92009 Unspecified place in unspecified non-institutional (private) residence as the place of occurrence of the external cause: Secondary | ICD-10-CM | POA: Insufficient documentation

## 2015-03-12 DIAGNOSIS — Z7982 Long term (current) use of aspirin: Secondary | ICD-10-CM | POA: Insufficient documentation

## 2015-03-12 DIAGNOSIS — Y9389 Activity, other specified: Secondary | ICD-10-CM | POA: Insufficient documentation

## 2015-03-12 DIAGNOSIS — F10120 Alcohol abuse with intoxication, uncomplicated: Secondary | ICD-10-CM | POA: Insufficient documentation

## 2015-03-12 DIAGNOSIS — Z720 Tobacco use: Secondary | ICD-10-CM | POA: Insufficient documentation

## 2015-03-12 DIAGNOSIS — S0001XA Abrasion of scalp, initial encounter: Secondary | ICD-10-CM | POA: Insufficient documentation

## 2015-03-12 DIAGNOSIS — W1839XA Other fall on same level, initial encounter: Secondary | ICD-10-CM | POA: Insufficient documentation

## 2015-03-12 DIAGNOSIS — F10920 Alcohol use, unspecified with intoxication, uncomplicated: Secondary | ICD-10-CM

## 2015-03-12 DIAGNOSIS — I10 Essential (primary) hypertension: Secondary | ICD-10-CM | POA: Insufficient documentation

## 2015-03-12 LAB — ETHANOL: Alcohol, Ethyl (B): 262 mg/dL — ABNORMAL HIGH (ref <5)

## 2015-03-12 LAB — RAPID URINE DRUG SCREEN, HOSP PERFORMED
Amphetamines: NOT DETECTED
Barbiturates: NOT DETECTED
Benzodiazepines: POSITIVE — AB
COCAINE: POSITIVE — AB
OPIATES: NOT DETECTED
Tetrahydrocannabinol: NOT DETECTED

## 2015-03-12 MED ORDER — TETANUS-DIPHTH-ACELL PERTUSSIS 5-2.5-18.5 LF-MCG/0.5 IM SUSP
0.5000 mL | Freq: Once | INTRAMUSCULAR | Status: AC
  Administered 2015-03-12: 0.5 mL via INTRAMUSCULAR
  Filled 2015-03-12: qty 0.5

## 2015-03-12 NOTE — ED Notes (Addendum)
Per EMS, pt stated he was drinking heavily today ( approximately a case of beer). Pt fell down at home and injured his head. Pt with hematoma and abrasion to right side of head, disoriented x 3. PTA pt received  of Halodol and 2.5mg  of Versed.
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
## 2015-03-13 NOTE — ED Notes (Signed)
Pt sleeping, BP 80s/40s, pt awakened and BP rechecked and now 100/64

## 2015-03-13 NOTE — ED Notes (Signed)
Pt verbalized understanding of d/c instructions and no further questons. Pt going home with sign other driving.

## 2015-03-13 NOTE — Discharge Instructions (Signed)
Alcohol Intoxication °Alcohol intoxication occurs when you drink enough alcohol that it affects your ability to function. It can be mild or very severe. Drinking a lot of alcohol in a short time is called binge drinking. This can be very harmful. Drinking alcohol can also be more dangerous if you are taking medicines or other drugs. Some of the effects caused by alcohol may include: °· Loss of coordination. °· Changes in mood and behavior. °· Unclear thinking. °· Trouble talking (slurred speech). °· Throwing up (vomiting). °· Confusion. °· Slowed breathing. °· Twitching and shaking (seizures). °· Loss of consciousness. °HOME CARE °· Do not drive after drinking alcohol. °· Drink enough water and fluids to keep your pee (urine) clear or pale yellow. Avoid caffeine. °· Only take medicine as told by your doctor. °GET HELP IF: °· You throw up (vomit) many times. °· You do not feel better after a few days. °· You frequently have alcohol intoxication. Your doctor can help decide if you should see a substance use treatment counselor. °GET HELP RIGHT AWAY IF: °· You become shaky when you stop drinking. °· You have twitching and shaking. °· You throw up blood. It may look bright red or like coffee grounds. °· You notice blood in your poop (bowel movements). °· You become lightheaded or pass out (faint). °MAKE SURE YOU:  °· Understand these instructions. °· Will watch your condition. °· Will get help right away if you are not doing well or get worse. °Document Released: 01/25/2008 Document Revised: 04/10/2013 Document Reviewed: 01/11/2013 °[employer]® Patient Information ©2015 [employer]. This information is not intended to replace advice given to you by your health care provider. Make sure you discuss any questions you have with your health care provider. ° °

## 2015-04-17 ENCOUNTER — Inpatient Hospital Stay (HOSPITAL_COMMUNITY)
Admission: EM | Admit: 2015-04-17 | Discharge: 2015-04-22 | DRG: 439 | Disposition: A | Discharge status: Home / Self Care | Payer: Self-pay | Attending: Family Medicine | Admitting: Family Medicine

## 2015-04-17 ENCOUNTER — Encounter (HOSPITAL_COMMUNITY): Payer: Self-pay | Admitting: *Deleted

## 2015-04-17 DIAGNOSIS — R109 Unspecified abdominal pain: Secondary | ICD-10-CM

## 2015-04-17 DIAGNOSIS — K8689 Other specified diseases of pancreas: Secondary | ICD-10-CM

## 2015-04-17 DIAGNOSIS — Z7982 Long term (current) use of aspirin: Secondary | ICD-10-CM

## 2015-04-17 DIAGNOSIS — F17200 Nicotine dependence, unspecified, uncomplicated: Secondary | ICD-10-CM | POA: Diagnosis present

## 2015-04-17 DIAGNOSIS — Z79899 Other long term (current) drug therapy: Secondary | ICD-10-CM

## 2015-04-17 DIAGNOSIS — E871 Hypo-osmolality and hyponatremia: Secondary | ICD-10-CM | POA: Diagnosis present

## 2015-04-17 DIAGNOSIS — R748 Abnormal levels of other serum enzymes: Secondary | ICD-10-CM | POA: Insufficient documentation

## 2015-04-17 DIAGNOSIS — I10 Essential (primary) hypertension: Secondary | ICD-10-CM | POA: Diagnosis present

## 2015-04-17 DIAGNOSIS — F1020 Alcohol dependence, uncomplicated: Secondary | ICD-10-CM | POA: Diagnosis present

## 2015-04-17 DIAGNOSIS — E876 Hypokalemia: Secondary | ICD-10-CM | POA: Diagnosis present

## 2015-04-17 DIAGNOSIS — E8729 Other acidosis: Secondary | ICD-10-CM | POA: Insufficient documentation

## 2015-04-17 DIAGNOSIS — K859 Acute pancreatitis without necrosis or infection, unspecified: Secondary | ICD-10-CM | POA: Insufficient documentation

## 2015-04-17 DIAGNOSIS — R7989 Other specified abnormal findings of blood chemistry: Secondary | ICD-10-CM | POA: Diagnosis present

## 2015-04-17 DIAGNOSIS — K852 Alcohol induced acute pancreatitis: Principal | ICD-10-CM | POA: Diagnosis present

## 2015-04-17 DIAGNOSIS — E872 Acidosis: Secondary | ICD-10-CM | POA: Insufficient documentation

## 2015-04-17 DIAGNOSIS — F1010 Alcohol abuse, uncomplicated: Secondary | ICD-10-CM | POA: Diagnosis present

## 2015-04-17 DIAGNOSIS — F17210 Nicotine dependence, cigarettes, uncomplicated: Secondary | ICD-10-CM | POA: Diagnosis present

## 2015-04-17 DIAGNOSIS — R935 Abnormal findings on diagnostic imaging of other abdominal regions, including retroperitoneum: Secondary | ICD-10-CM | POA: Diagnosis present

## 2015-04-17 LAB — COMPREHENSIVE METABOLIC PANEL
ALK PHOS: 65 U/L (ref 38–126)
ALT: 86 U/L — AB (ref 17–63)
AST: 60 U/L — AB (ref 15–41)
Albumin: 4.3 g/dL (ref 3.5–5.0)
Anion gap: 17 — ABNORMAL HIGH (ref 5–15)
CALCIUM: 9.4 mg/dL (ref 8.9–10.3)
CHLORIDE: 86 mmol/L — AB (ref 101–111)
CO2: 23 mmol/L (ref 22–32)
CREATININE: 0.78 mg/dL (ref 0.61–1.24)
GFR calc Af Amer: >60 mL/min (ref >60)
Glucose, Bld: 87 mg/dL (ref 65–99)
Potassium: 3 mmol/L — ABNORMAL LOW (ref 3.5–5.1)
Sodium: 126 mmol/L — ABNORMAL LOW (ref 135–145)
Total Bilirubin: 1.2 mg/dL (ref 0.3–1.2)
Total Protein: 7.1 g/dL (ref 6.5–8.1)

## 2015-04-17 LAB — URINALYSIS, ROUTINE W REFLEX MICROSCOPIC
GLUCOSE, UA: NEGATIVE mg/dL
HGB URINE DIPSTICK: NEGATIVE
KETONES UR: 15 mg/dL — AB
Leukocytes, UA: NEGATIVE
Nitrite: NEGATIVE
PH: 6.5 (ref 5.0–8.0)
Protein, ur: NEGATIVE mg/dL
Specific Gravity, Urine: 1.022 (ref 1.005–1.030)
Urobilinogen, UA: 1 mg/dL (ref 0.0–1.0)

## 2015-04-17 LAB — I-STAT CG4 LACTIC ACID, ED: LACTIC ACID, VENOUS: 1.31 mmol/L (ref 0.5–2.0)

## 2015-04-17 LAB — CBC
HCT: 39.4 % (ref 39.0–52.0)
Hemoglobin: 13.9 g/dL (ref 13.0–17.0)
MCH: 33.4 pg (ref 26.0–34.0)
MCHC: 35.3 g/dL (ref 30.0–36.0)
MCV: 94.7 fL (ref 78.0–100.0)
PLATELETS: 151 10*3/uL (ref 150–400)
RBC: 4.16 MIL/uL — ABNORMAL LOW (ref 4.22–5.81)
RDW: 13.5 % (ref 11.5–15.5)
WBC: 8.1 10*3/uL (ref 4.0–10.5)

## 2015-04-17 LAB — LIPASE, BLOOD: LIPASE: 299 U/L — AB (ref 22–51)

## 2015-04-17 LAB — MAGNESIUM: Magnesium: 1.8 mg/dL (ref 1.7–2.4)

## 2015-04-17 LAB — ETHANOL: Alcohol, Ethyl (B): <5 mg/dL (ref <5)

## 2015-04-17 MED ORDER — DEXTROSE-NACL 5-0.9 % IV SOLN
INTRAVENOUS | Status: DC
  Administered 2015-04-17: 23:00:00 via INTRAVENOUS

## 2015-04-17 MED ORDER — LORAZEPAM 1 MG PO TABS: 0.0000 mg | ORAL_TABLET | Freq: Four times a day (QID) | ORAL | Status: DC

## 2015-04-17 MED ORDER — LORAZEPAM 1 MG PO TABS: 0.0000 mg | ORAL_TABLET | Freq: Two times a day (BID) | ORAL | Status: DC

## 2015-04-17 MED ORDER — THIAMINE HCL 100 MG/ML IJ SOLN: 100.0000 mg | Freq: Every day | INTRAMUSCULAR | Status: DC

## 2015-04-17 MED ORDER — MORPHINE SULFATE (PF) 4 MG/ML IV SOLN
4.0000 mg | INTRAVENOUS | Status: AC | PRN
  Administered 2015-04-17 – 2015-04-19 (×3): 4 mg via INTRAVENOUS
  Filled 2015-04-17 (×3): qty 1

## 2015-04-17 MED ORDER — VITAMIN B-1 100 MG PO TABS
100.0000 mg | ORAL_TABLET | Freq: Every day | ORAL | Status: DC
  Administered 2015-04-18 – 2015-04-22 (×5): 100 mg via ORAL
  Filled 2015-04-17 (×5): qty 1

## 2015-04-17 MED ORDER — MORPHINE SULFATE (PF) 4 MG/ML IV SOLN: 4.0000 mg | Freq: Once | INTRAVENOUS | Status: DC

## 2015-04-17 MED ORDER — LORAZEPAM 2 MG/ML IJ SOLN
0.0000 mg | Freq: Four times a day (QID) | INTRAMUSCULAR | Status: AC
  Administered 2015-04-18 – 2015-04-19 (×3): 2 mg via INTRAVENOUS
  Filled 2015-04-17 (×3): qty 1

## 2015-04-17 MED ORDER — LORAZEPAM 2 MG/ML IJ SOLN
0.0000 mg | Freq: Two times a day (BID) | INTRAMUSCULAR | Status: DC
Start: 2015-04-17 — End: 2015-04-18

## 2015-04-17 MED ORDER — SODIUM CHLORIDE 0.9 % IV BOLUS (SEPSIS)
1000.0000 mL | Freq: Once | INTRAVENOUS | Status: AC
  Administered 2015-04-17: 1000 mL via INTRAVENOUS

## 2015-04-17 MED ORDER — ONDANSETRON HCL 4 MG/2ML IJ SOLN
4.0000 mg | Freq: Once | INTRAMUSCULAR | Status: AC
  Administered 2015-04-17: 4 mg via INTRAVENOUS
  Filled 2015-04-17: qty 2

## 2015-04-17 NOTE — ED Notes (Signed)
Pt generalized abdominal pain that started 2-3 days ago. Pt states that he has had N/V/D as well. Pt states that pain constant.

## 2015-04-17 NOTE — ED Provider Notes (Signed)
CSN: [id number]     Arrival date & time 04/17/15  1841 History   First MD Initiated Contact with Patient 04/17/15 2140     Chief Complaint  Patient presents with  . Abdominal Pain     (Consider location/radiation/quality/duration/timing/severity/associated sxs/prior Treatment) HPI 49 year old male with history of alcohol dependence who presents with abdominal pain for 1 day. He reports drinking 3-4 40 ounces per day. After drinking yesterday, he developed generalized abdominal pain with associated nausea, vomiting, and diarrhea. Denies coffee ground or bloody emesis, and denies any melena or hematochezia. He has not had any urinary symptoms, fevers, or chills. He denies any prior abdominal surgeries, and denies history of alcohol withdrawal or withdrawal seizures.   Past Medical History  Diagnosis Date  . Hypertension    Past Surgical History  Procedure Laterality Date  . Hemorrhoid surgery    . Right knee surgery      Family History  Problem Relation Age of Onset  . Hypertension Mother   . Diabetes Mother   . Stroke Maternal Grandmother    Social History  Substance Use Topics  . Smoking status: Current Every Day Smoker -- 0.50 packs/day for 30 years    Types: Cigarettes  . Smokeless tobacco: None  . Alcohol Use: 3.6 oz/week    6 Cans of beer per week     Comment: everyday     Review of Systems 10/14 systems reviewed and are negative other than those stated in the HPI   Allergies  Review of patient's allergies indicates no known allergies.  Home Medications   Prior to Admission medications   Medication Sig Start Date End Date Taking? Authorizing Provider  amLODipine (NORVASC) 5 MG tablet Take 1 tablet (5 mg total) by mouth daily. 01/26/15  Yes Doris Cheadle, MD  aspirin (ASPIRIN EC) 81 MG EC tablet Take 81 mg by mouth daily as needed for pain. Swallow whole.   Yes Historical Provider, MD  buPROPion (WELLBUTRIN) 100 MG tablet Take 1 tablet (100 mg total) by mouth 2  (two) times daily. 01/26/15  Yes Doris Cheadle, MD  cimetidine (TAGAMET) 200 MG tablet Take 200 mg by mouth daily.   Yes Historical Provider, MD  hydrochlorothiazide (HYDRODIURIL) 25 MG tablet Take 1 tablet (25 mg total) by mouth daily. 01/26/15  Yes Doris Cheadle, MD  lisinopril (PRINIVIL,ZESTRIL) 40 MG tablet Take 1 tablet (40 mg total) by mouth daily. 01/26/15  Yes Doris Cheadle, MD  simethicone (MYLICON) 125 MG chewable tablet Chew 125 mg by mouth every 6 (six) hours as needed for flatulence.   Yes Historical Provider, MD   BP 135/85 mmHg  Pulse 89  Temp(Src) 97.8 F (36.6 C) (Oral)  Resp 12  SpO2 98% Physical Exam Physical Exam  Nursing note and vitals reviewed. Constitutional: Well developed, well nourished, non-toxic, and in no acute distress Head: Normocephalic and atraumatic.  Mouth/Throat: Oropharynx is clear and dry.  Neck: Normal range of motion. Neck supple.  Cardiovascular: Tachycardic rate and regular rhythm. No edema   Pulmonary/Chest: Effort normal and breath sounds normal.  Abdominal: Soft. Obese, mildly distended. There is diffuse tenderness, worse in the epigastric and left upper quadrant. There is no rebound and no guarding.  Musculoskeletal: Normal range of motion.  Neurological: Alert, no facial droop, fluent speech, moves all extremities symmetrically Skin: Skin is warm and dry.  Psychiatric: Cooperative  ED Course  Procedures (including critical care time) Labs Review Labs Reviewed  LIPASE, BLOOD - Abnormal; Notable for the following:  Lipase 299 (*)    All other components within normal limits  COMPREHENSIVE METABOLIC PANEL - Abnormal; Notable for the following:    Sodium 126 (*)    Potassium 3.0 (*)    Chloride 86 (*)    BUN <5 (*)    AST 60 (*)    ALT 86 (*)    Anion gap 17 (*)    All other components within normal limits  CBC - Abnormal; Notable for the following:    RBC 4.16 (*)    All other components within normal limits  URINALYSIS, ROUTINE W  REFLEX MICROSCOPIC (NOT AT [facility]) - Abnormal; Notable for the following:    Color, Urine AMBER (*)    Bilirubin Urine SMALL (*)    Ketones, ur 15 (*)    All other components within normal limits  ETHANOL  MAGNESIUM  I-STAT CG4 LACTIC ACID, ED   I have personally reviewed and evaluated these images and lab results as part of my medical decision-making.    MDM   Final diagnoses:  Acute pancreatitis, unspecified pancreatitis type  Alcoholic ketoacidosis    49 year old male with history of alcohol dependence who presents with abdominal pain. On arrival, he is nontoxic and in no acute distress. Initially mildly tachycardic with a heart rate between the 100 and 110. I remainder of vital signs is otherwise unremarkable. On exam he is a soft and nonsurgical abdomen, with diffuse tenderness, worst in the left upper quadrant, umbilical and epigastric region. Blood work reveals evidence of acute pancreatitis with lipase of 299. He also has anion gap of 17, with normal lactate, and likely alcoholic ketoacidosis. He is given 1 L of normal saline and then started on a D5 normal saline drip. Placed on CIWA protocol and also given thiamine. Morphine and zofran given for symptomatic management. Admitted to [facility] hospitalist for ongoing management.   Lavera Guise, MD 04/17/15 [phone]

## 2015-04-17 NOTE — ED Notes (Addendum)
Pt reports that he drinks at least 3 40oz beers daily, sometime more.  He wonders if this is causing his pain.  Last drink was 4 hours ago,

## 2015-04-17 NOTE — ED Notes (Signed)
Dr. Liu at bedside 

## 2015-04-18 ENCOUNTER — Inpatient Hospital Stay (HOSPITAL_COMMUNITY): Payer: Self-pay

## 2015-04-18 DIAGNOSIS — K859 Acute pancreatitis without necrosis or infection, unspecified: Secondary | ICD-10-CM | POA: Diagnosis present

## 2015-04-18 DIAGNOSIS — F1010 Alcohol abuse, uncomplicated: Secondary | ICD-10-CM | POA: Diagnosis present

## 2015-04-18 DIAGNOSIS — E876 Hypokalemia: Secondary | ICD-10-CM

## 2015-04-18 DIAGNOSIS — I10 Essential (primary) hypertension: Secondary | ICD-10-CM

## 2015-04-18 DIAGNOSIS — F17210 Nicotine dependence, cigarettes, uncomplicated: Secondary | ICD-10-CM

## 2015-04-18 LAB — LIPID PANEL
CHOL/HDL RATIO: 2 ratio
Cholesterol: 195 mg/dL (ref 0–200)
HDL: 98 mg/dL (ref >40)
LDL CALC: 90 mg/dL (ref 0–99)
TRIGLYCERIDES: 36 mg/dL (ref <150)
VLDL: 7 mg/dL (ref 0–40)

## 2015-04-18 LAB — COMPREHENSIVE METABOLIC PANEL
ALBUMIN: 3.7 g/dL (ref 3.5–5.0)
ALT: 69 U/L — ABNORMAL HIGH (ref 17–63)
AST: 44 U/L — AB (ref 15–41)
Alkaline Phosphatase: 59 U/L (ref 38–126)
Anion gap: 8 (ref 5–15)
BILIRUBIN TOTAL: 1.4 mg/dL — AB (ref 0.3–1.2)
CHLORIDE: 94 mmol/L — AB (ref 101–111)
CO2: 29 mmol/L (ref 22–32)
Calcium: 9.1 mg/dL (ref 8.9–10.3)
Creatinine, Ser: 0.83 mg/dL (ref 0.61–1.24)
GFR calc Af Amer: >60 mL/min (ref >60)
GFR calc non Af Amer: >60 mL/min (ref >60)
GLUCOSE: 132 mg/dL — AB (ref 65–99)
POTASSIUM: 3.6 mmol/L (ref 3.5–5.1)
SODIUM: 131 mmol/L — AB (ref 135–145)
Total Protein: 6.6 g/dL (ref 6.5–8.1)

## 2015-04-18 LAB — CBC
HEMATOCRIT: 37.2 % — AB (ref 39.0–52.0)
Hemoglobin: 13.4 g/dL (ref 13.0–17.0)
MCH: 34 pg (ref 26.0–34.0)
MCHC: 36 g/dL (ref 30.0–36.0)
MCV: 94.4 fL (ref 78.0–100.0)
PLATELETS: 157 10*3/uL (ref 150–400)
RBC: 3.94 MIL/uL — ABNORMAL LOW (ref 4.22–5.81)
RDW: 13.4 % (ref 11.5–15.5)
WBC: 6.8 10*3/uL (ref 4.0–10.5)

## 2015-04-18 LAB — LIPASE, BLOOD: Lipase: 214 U/L — ABNORMAL HIGH (ref 22–51)

## 2015-04-18 LAB — PHOSPHORUS: Phosphorus: 3.9 mg/dL (ref 2.5–4.6)

## 2015-04-18 LAB — I-STAT CG4 LACTIC ACID, ED: LACTIC ACID, VENOUS: 0.62 mmol/L (ref 0.5–2.0)

## 2015-04-18 LAB — MAGNESIUM: Magnesium: 3.1 mg/dL — ABNORMAL HIGH (ref 1.7–2.4)

## 2015-04-18 MED ORDER — SODIUM CHLORIDE 0.9 % IJ SOLN
3.0000 mL | Freq: Two times a day (BID) | INTRAMUSCULAR | Status: DC
  Administered 2015-04-20 – 2015-04-21 (×2): 3 mL via INTRAVENOUS

## 2015-04-18 MED ORDER — SIMETHICONE 80 MG PO CHEW: 120.0000 mg | CHEWABLE_TABLET | Freq: Four times a day (QID) | ORAL | Status: DC | PRN

## 2015-04-18 MED ORDER — HYDROMORPHONE HCL 1 MG/ML IJ SOLN
1.0000 mg | INTRAMUSCULAR | Status: DC | PRN
  Administered 2015-04-18: 1 mg via INTRAVENOUS
  Filled 2015-04-18: qty 1

## 2015-04-18 MED ORDER — PANTOPRAZOLE SODIUM 40 MG IV SOLR
40.0000 mg | INTRAVENOUS | Status: DC
  Administered 2015-04-18 – 2015-04-19 (×2): 40 mg via INTRAVENOUS
  Filled 2015-04-18 (×2): qty 40

## 2015-04-18 MED ORDER — HYDROMORPHONE HCL 1 MG/ML IJ SOLN
1.0000 mg | INTRAMUSCULAR | Status: DC | PRN
  Administered 2015-04-18 (×5): 1 mg via INTRAVENOUS
  Filled 2015-04-18 (×5): qty 1

## 2015-04-18 MED ORDER — KCL IN DEXTROSE-NACL 40-5-0.9 MEQ/L-%-% IV SOLN
INTRAVENOUS | Status: DC
  Administered 2015-04-18: 04:00:00 via INTRAVENOUS
  Filled 2015-04-18 (×3): qty 1000

## 2015-04-18 MED ORDER — NICOTINE 21 MG/24HR TD PT24
21.0000 mg | MEDICATED_PATCH | Freq: Every day | TRANSDERMAL | Status: DC
  Administered 2015-04-18 – 2015-04-22 (×5): 21 mg via TRANSDERMAL
  Filled 2015-04-18 (×5): qty 1

## 2015-04-18 MED ORDER — LISINOPRIL 40 MG PO TABS
40.0000 mg | ORAL_TABLET | Freq: Every day | ORAL | Status: DC
  Administered 2015-04-18 – 2015-04-22 (×5): 40 mg via ORAL
  Filled 2015-04-18 (×5): qty 1

## 2015-04-18 MED ORDER — SODIUM CHLORIDE 0.9 % IV SOLN
INTRAVENOUS | Status: DC
  Administered 2015-04-18 – 2015-04-19 (×3): via INTRAVENOUS
  Filled 2015-04-18 (×5): qty 1000

## 2015-04-18 MED ORDER — GI COCKTAIL ~~LOC~~
30.0000 mL | Freq: Three times a day (TID) | ORAL | Status: DC | PRN
  Filled 2015-04-18: qty 30

## 2015-04-18 MED ORDER — HYDROMORPHONE HCL 1 MG/ML IJ SOLN
1.0000 mg | INTRAMUSCULAR | Status: DC | PRN
  Administered 2015-04-18 – 2015-04-19 (×3): 1 mg via INTRAVENOUS
  Filled 2015-04-18 (×3): qty 1

## 2015-04-18 MED ORDER — ENOXAPARIN SODIUM 40 MG/0.4ML ~~LOC~~ SOLN
40.0000 mg | SUBCUTANEOUS | Status: DC
  Administered 2015-04-18 – 2015-04-22 (×5): 40 mg via SUBCUTANEOUS
  Filled 2015-04-18 (×5): qty 0.4

## 2015-04-18 MED ORDER — LORAZEPAM 2 MG/ML IJ SOLN
0.0000 mg | Freq: Two times a day (BID) | INTRAMUSCULAR | Status: DC
  Administered 2015-04-22: 2 mg via INTRAVENOUS
  Filled 2015-04-18: qty 1

## 2015-04-18 MED ORDER — BUPROPION HCL 100 MG PO TABS
100.0000 mg | ORAL_TABLET | Freq: Two times a day (BID) | ORAL | Status: DC
  Administered 2015-04-18 – 2015-04-22 (×8): 100 mg via ORAL
  Filled 2015-04-18 (×9): qty 1

## 2015-04-18 MED ORDER — SODIUM CHLORIDE 0.9 % IV BOLUS (SEPSIS)
2000.0000 mL | Freq: Once | INTRAVENOUS | Status: AC
  Administered 2015-04-18: 2000 mL via INTRAVENOUS

## 2015-04-18 MED ORDER — ONDANSETRON HCL 4 MG/2ML IJ SOLN: 4.0000 mg | Freq: Three times a day (TID) | INTRAMUSCULAR | Status: AC | PRN

## 2015-04-18 MED ORDER — MAGNESIUM SULFATE 4 GM/100ML IV SOLN
4.0000 g | Freq: Once | INTRAVENOUS | Status: AC
  Administered 2015-04-18: 4 g via INTRAVENOUS
  Filled 2015-04-18: qty 100

## 2015-04-18 NOTE — Progress Notes (Signed)
Time seen and assessed patient and agree with Dr. Robb Matar assessment and plan. Patient is a 49 year old gentleman history of alcohol use/dependence presented to the ED with abdominal pain nausea vomiting. Patient diagnosed with an acute pancreatitis likely alcohol induced. Fasting lipid panel was checked and triglycerides at 36 with LDL of 90 and a total cholesterol of 195. Abdominal ultrasound pending. Will give a 2 L bolus. Increase IV fluids 150 mL per hour. Continue bowel rest, pain management, supportive care. Follow.

## 2015-04-18 NOTE — ED Notes (Signed)
Transporting patient to new room assignment. 

## 2015-04-18 NOTE — H&P (Signed)
[facility] History and Physical  WATSON ROBARGE RUE:[id number] DOB: Oct 05, 1965 DOA: 04/17/2015  Referring physician: Lavera Guise, MD PCP: No PCP Per Patient   Chief Complaint: Abdominal pain.  HPI: Jimmy Zimmerman is a 49 y.o. male with past medical history of hypertension, alcohol abuse, hemorrhoids who comes to the emergency department due to abdominal pain associated with nausea, 2 episodes of emesis and several episodes of diarrhea after drinking alcohol on Thursday evening. Patient reports drinking anywhere from about 3 to 5 forty ounces cans of beer on a daily basis. He denies melena or hematochezia. He denies any previous episodes of pancreatitis to his knowledge, and does not have a history of detox admissions due to alcohol.  He is currently in no acute distress.  Review of Systems:  Constitutional:  No weight loss, night sweats, Fevers, chills, fatigue.  HEENT:  No headaches, Difficulty swallowing,Tooth/dental problems,Sore throat,  No sneezing, itching, ear ache, nasal congestion, post nasal drip,  Cardio-vascular:  No chest pain, Orthopnea, PND, swelling in lower extremities, anasarca, dizziness, palpitations  GI:  Positive abdominal pain, nausea, vomiting, diarrhea, loss of appetite  No heartburn, indigestion,  change in bowel habits,  Resp:  No shortness of breath with exertion or at rest. Occasional productive cough, No non-productive cough, No coughing up of blood.No change in color of mucus.No wheezing.No chest wall deformity  Skin:  no rash or lesions.  GU:  no dysuria, change in color of urine, no urgency or frequency. No flank pain.  Musculoskeletal:  Occasional right knee pain.  No decreased range of motion. No back pain.  Psych:  No change in mood or affect. No depression or anxiety. No memory loss.   Past Medical History  Diagnosis Date  . Hypertension    Past Surgical History  Procedure Laterality Date  . Hemorrhoid surgery    . Right knee  surgery      Social History:  reports that he has been smoking Cigarettes.  He has a 15 pack-year smoking history. He does not have any smokeless tobacco history on file. He reports that he drinks about 3.6 oz of alcohol per week. He reports that he does not use illicit drugs.  No Known Allergies  Family History  Problem Relation Age of Onset  . Hypertension Mother   . Diabetes Mother   . Stroke Maternal Grandmother     Prior to Admission medications   Medication Sig Start Date End Date Taking? Authorizing Provider  amLODipine (NORVASC) 5 MG tablet Take 1 tablet (5 mg total) by mouth daily. 01/26/15  Yes Doris Cheadle, MD  aspirin (ASPIRIN EC) 81 MG EC tablet Take 81 mg by mouth daily as needed for pain. Swallow whole.   Yes Historical Provider, MD  buPROPion (WELLBUTRIN) 100 MG tablet Take 1 tablet (100 mg total) by mouth 2 (two) times daily. 01/26/15  Yes Doris Cheadle, MD  cimetidine (TAGAMET) 200 MG tablet Take 200 mg by mouth daily.   Yes Historical Provider, MD  hydrochlorothiazide (HYDRODIURIL) 25 MG tablet Take 1 tablet (25 mg total) by mouth daily. 01/26/15  Yes Doris Cheadle, MD  lisinopril (PRINIVIL,ZESTRIL) 40 MG tablet Take 1 tablet (40 mg total) by mouth daily. 01/26/15  Yes Doris Cheadle, MD  simethicone (MYLICON) 125 MG chewable tablet Chew 125 mg by mouth every 6 (six) hours as needed for flatulence.   Yes Historical Provider, MD   Physical Exam: Filed Vitals:   04/18/15 0030 04/18/15 0045 04/18/15 0102 04/18/15 1478  BP: 121/76 120/78 131/79 127/85  Pulse: 87 85 85 85  Temp:    98.6 F (37 C)  TempSrc:    Oral  Resp:   12 17  Height:     (1.676 m)  Weight:    73.12 kg (161 lb 3.2 oz)  SpO2: 97% 96% 95% 98%    Wt Readings from Last 3 Encounters:  04/18/15 73.12 kg (161 lb 3.2 oz)  01/26/15 74.39 kg (164 lb)  12/22/14 77.565 kg (171 lb)    General:  Appears calm and comfortable Eyes: PERRL, normal lids, irises & conjunctiva ENT: grossly normal hearing, lips &  tongue Neck: no LAD, masses or thyromegaly Cardiovascular: RRR, no m/r/g. No LE edema. Respiratory: CTA bilaterally, no w/r/r. Normal respiratory effort. Abdomen: soft, positive epigastric tenderness without guarding or rebound tenderness. Skin: no rash or induration seen on limited exam Musculoskeletal: grossly normal tone BUE/BLE Psychiatric: grossly normal mood and affect, speech fluent and appropriate Neurologic: grossly non-focal.          Labs on Admission:  Basic Metabolic Panel:  Recent Labs Lab 04/17/15 1914 04/17/15 2224  NA 126*  --   K 3.0*  --   CL 86*  --   CO2 23  --   GLUCOSE 87  --   BUN <5*  --   CREATININE 0.78  --   CALCIUM 9.4  --   MG  --  1.8   Liver Function Tests:  Recent Labs Lab 04/17/15 1914  AST 60*  ALT 86*  ALKPHOS 65  BILITOT 1.2  PROT 7.1  ALBUMIN 4.3    Recent Labs Lab 04/17/15 1914  LIPASE 299*   No results for input(s): AMMONIA in the last 168 hours. CBC:  Recent Labs Lab 04/17/15 1914  WBC 8.1  HGB 13.9  HCT 39.4  MCV 94.7  PLT 151     Assessment/Plan Principal Problem:   Acute pancreatitis Continue IV hydration. Pain management as needed. Follow-up lipase level, liver function tests and electrolytes. Patient was advised to avoid alcohol consumption and fatty or fried foods.  Active Problems:   Essential hypertension Continue current therapy and monitor blood pressure.    Nicotine dependence Agreed to try NicoDerm patches.    Alcohol abuse Continue CIWA protocol    Hypokalemia/hyponatremia Currently replacing. Follow-up sodium and potassium levels. We'll also replace magnesium due to his history of alcoholism. Check phosphorus level.   Code Status: Full code. DVT Prophylaxis: Lovenox SQ. Family Communication:  Disposition Plan: Admit for pain management, hydration, keep nothing by mouth and discharge home once the patient lipase normalizes and he is tolerating oral intake.  Time spent: Over  60 minutes.  Bobette Mo [facility] Pager [phone].

## 2015-04-19 ENCOUNTER — Inpatient Hospital Stay (HOSPITAL_COMMUNITY): Payer: Self-pay

## 2015-04-19 ENCOUNTER — Encounter (HOSPITAL_COMMUNITY): Payer: Self-pay | Admitting: Radiology

## 2015-04-19 DIAGNOSIS — F17200 Nicotine dependence, unspecified, uncomplicated: Secondary | ICD-10-CM

## 2015-04-19 DIAGNOSIS — R935 Abnormal findings on diagnostic imaging of other abdominal regions, including retroperitoneum: Secondary | ICD-10-CM | POA: Diagnosis present

## 2015-04-19 LAB — COMPREHENSIVE METABOLIC PANEL
ALK PHOS: 58 U/L (ref 38–126)
ALT: 54 U/L (ref 17–63)
AST: 34 U/L (ref 15–41)
Albumin: 3.4 g/dL — ABNORMAL LOW (ref 3.5–5.0)
Anion gap: 8 (ref 5–15)
BUN: 5 mg/dL — AB (ref 6–20)
CALCIUM: 8.4 mg/dL — AB (ref 8.9–10.3)
CHLORIDE: 104 mmol/L (ref 101–111)
CO2: 23 mmol/L (ref 22–32)
CREATININE: 0.89 mg/dL (ref 0.61–1.24)
GFR calc Af Amer: >60 mL/min (ref >60)
GFR calc non Af Amer: >60 mL/min (ref >60)
GLUCOSE: 86 mg/dL (ref 65–99)
Potassium: 4.5 mmol/L (ref 3.5–5.1)
SODIUM: 135 mmol/L (ref 135–145)
Total Bilirubin: 1.7 mg/dL — ABNORMAL HIGH (ref 0.3–1.2)
Total Protein: 5.9 g/dL — ABNORMAL LOW (ref 6.5–8.1)

## 2015-04-19 LAB — LIPASE, BLOOD: Lipase: 148 U/L — ABNORMAL HIGH (ref 22–51)

## 2015-04-19 LAB — CBC
HCT: 36.5 % — ABNORMAL LOW (ref 39.0–52.0)
Hemoglobin: 12.3 g/dL — ABNORMAL LOW (ref 13.0–17.0)
MCH: 33.8 pg (ref 26.0–34.0)
MCHC: 33.7 g/dL (ref 30.0–36.0)
MCV: 100.3 fL — AB (ref 78.0–100.0)
PLATELETS: 133 10*3/uL — AB (ref 150–400)
RBC: 3.64 MIL/uL — ABNORMAL LOW (ref 4.22–5.81)
RDW: 13.9 % (ref 11.5–15.5)
WBC: 5.2 10*3/uL (ref 4.0–10.5)

## 2015-04-19 MED ORDER — LORAZEPAM 2 MG/ML IJ SOLN
0.0000 mg | Freq: Four times a day (QID) | INTRAMUSCULAR | Status: AC
  Administered 2015-04-19: 4 mg via INTRAVENOUS
  Administered 2015-04-20 – 2015-04-21 (×6): 2 mg via INTRAVENOUS
  Filled 2015-04-19 (×4): qty 1
  Filled 2015-04-19: qty 2
  Filled 2015-04-19 (×2): qty 1

## 2015-04-19 MED ORDER — IOHEXOL 300 MG/ML  SOLN
100.0000 mL | Freq: Once | INTRAMUSCULAR | Status: AC | PRN
  Administered 2015-04-19: 100 mL via INTRAVENOUS

## 2015-04-19 MED ORDER — IOHEXOL 300 MG/ML  SOLN
25.0000 mL | INTRAMUSCULAR | Status: AC
  Administered 2015-04-19 (×2): 25 mL via ORAL

## 2015-04-19 MED ORDER — AMLODIPINE BESYLATE 5 MG PO TABS
5.0000 mg | ORAL_TABLET | Freq: Every day | ORAL | Status: DC
  Administered 2015-04-19 – 2015-04-20 (×2): 5 mg via ORAL
  Filled 2015-04-19 (×2): qty 1

## 2015-04-19 MED ORDER — SODIUM CHLORIDE 0.9 % IV SOLN
INTRAVENOUS | Status: DC
  Administered 2015-04-19 – 2015-04-22 (×10): via INTRAVENOUS
  Filled 2015-04-19: qty 1000

## 2015-04-19 MED ORDER — HYDROMORPHONE HCL 1 MG/ML IJ SOLN
1.0000 mg | INTRAMUSCULAR | Status: DC | PRN
  Administered 2015-04-19 – 2015-04-21 (×10): 1 mg via INTRAVENOUS
  Filled 2015-04-19 (×10): qty 1

## 2015-04-19 NOTE — Progress Notes (Signed)
[facility] PROGRESS NOTE  SATOSHI KALAS WUJ:[id number] DOB: 07-18-1966 DOA: 04/17/2015 PCP: No PCP Per Patient  Assessment/Plan: #1 acute pancreatitis likely alcohol induced Patient with clinical improvement. No nausea no emesis. Abdominal pain has improved. Lipase levels trending down. Fasting lipid panel with normal triglyceride levels. Abdominal ultrasound negative for cholelithiasis however does show fatty infiltration of the liver with 2 areas of hypoechoic regions noted. No intrahepatic biliary dilatation. Continue IV fluids. Will place patient on a clear liquid diet. Supportive care. Follow.  #2 abnormal abdominal ultrasound Abdominal ultrasound showing fatty infiltration of the liver with 2 areas of hypoechoic regions noted. Will get a CT of the abdomen and pelvis for further evaluation.  #3 hypertension Home regimen of lisinopril and Norvasc have been resumed. Follow.  #4 alcohol abuse Stable. Patient with no signs of withdrawal. Continue the Ativan withdrawal protocol. Follow.  #5 tobacco abuse Nicotine patch.  #6 hypokalemia/hyponatremia Replete. Magnesium level was elevated at 3.1. Follow.  #7 prophylaxis PPI for GI prophylaxis. Lovenox for DVT prophylaxis.  Code Status: Full Family Communication: Updated patient. No family at bedside. Disposition Plan: Home when medically stable and tolerating oral intake with resolution of abdominal pain.   Consultants:  None  Procedures:  Abdominal ultrasound 04/18/2015    Antibiotics:  None  HPI/Subjective: Patient states significant improvement with his abdominal pain. Patient denies nausea or vomiting. Patient states last requests for pain medication was around 5 AM today. No nausea. No emesis.  Objective: Filed Vitals:   04/19/15 0946  BP: 135/91  Pulse:   Temp:   Resp:     Intake/Output Summary (Last 24 hours) at 04/19/15 1130 Last data filed at 04/19/15 0900  Gross per 24 hour  Intake   3975 ml   Output      0 ml  Net   3975 ml   Filed Weights   04/18/15 0118  Weight: 73.12 kg (161 lb 3.2 oz)    Exam:   General:  NAD  Cardiovascular: RRR  Respiratory: CTAB  Abdomen: Soft, nontender, nondistended, positive bowel sounds.  Musculoskeletal: No clubbing cyanosis or edema.  Data Reviewed: Basic Metabolic Panel:  Recent Labs Lab 04/17/15 1914 04/17/15 2224 04/18/15 0317 04/18/15 0853 04/19/15 0519  NA 126*  --  131*  --  135  K 3.0*  --  3.6  --  4.5  CL 86*  --  94*  --  104  CO2 23  --  29  --  23  GLUCOSE 87  --  132*  --  86  BUN <5*  --  <5*  --  5*  CREATININE 0.78  --  0.83  --  0.89  CALCIUM 9.4  --  9.1  --  8.4*  MG  --  1.8  --  3.1*  --   PHOS  --   --  3.9  --   --    Liver Function Tests:  Recent Labs Lab 04/17/15 1914 04/18/15 0317 04/19/15 0519  AST 60* 44* 34  ALT 86* 69* 54  ALKPHOS 65 59 58  BILITOT 1.2 1.4* 1.7*  PROT 7.1 6.6 5.9*  ALBUMIN 4.3 3.7 3.4*    Recent Labs Lab 04/17/15 1914 04/18/15 0317 04/19/15 0519  LIPASE 299* 214* 148*   No results for input(s): AMMONIA in the last 168 hours. CBC:  Recent Labs Lab 04/17/15 1914 04/18/15 0317 04/19/15 0519  WBC 8.1 6.8 5.2  HGB 13.9 13.4 12.3*  HCT 39.4 37.2* 36.5*  MCV  94.7 94.4 100.3*  PLT 151 157 133*   Cardiac Enzymes: No results for input(s): CKTOTAL, CKMB, CKMBINDEX, TROPONINI in the last 168 hours. BNP (last 3 results) No results for input(s): BNP in the last 8760 hours.  ProBNP (last 3 results) No results for input(s): PROBNP in the last 8760 hours.  CBG: No results for input(s): GLUCAP in the last 168 hours.  No results found for this or any previous visit (from the past 240 hour(s)).   Studies: US Abdomen Complete  04/18/2015   CLINICAL DATA:  Pancreatitis, history hypertension, smoking  EXAM: ULTRASOUND ABDOMEN COMPLETE  COMPARISON:  None  FINDINGS: Gallbladder: Well distended without stones or wall thickening. No pericholecystic fluid or  sonographic Murphy sign.  Common bile duct: Diameter: 5 mm diameter , normal  Liver: Echogenic, likely fatty infiltration, though this can be seen with cirrhosis and certain infiltrative disorders. Hypoechoic region RIGHT lobe centrally 17 x 13 x 14 mm, nonspecific. Additional vague area of questionable hypo echogenicity at RIGHT lobe adjacent to upper pole of RIGHT kidney 3.2 x 2.0 x 2.7 cm. Hepatopetal portal venous flow. No intrahepatic biliary dilatation.  IVC: Normal appearance  Pancreas: Head suboptimally visualized due to obscuration by gas. Body and tail of pancreas grossly unremarkable.  Spleen: Normal appearance, 4.9 cm length  Right Kidney: Length: 11.1 cm. Normal morphology without mass or hydronephrosis.  Left Kidney: Length: 10.8 cm. Normal morphology without mass or hydronephrosis.  Abdominal aorta: Normal calibre at proximal and mid portions, distal portion and bifurcation obscured by bowel gas.  Other findings: No ascites.  IMPRESSION: Probable fatty infiltration of liver with a hypoechoic nodule within the RIGHT lobe 17 mm diameter and a second area of questionable hypo echogenicity near the upper pole of RIGHT kidney 3.2 cm greatest size.  Further characterization of these areas by CT or MR imaging with contrast recommended to exclude tumor.  Inadequate visualization of pancreatic head due to bowel gas, can also be assessed by CT.  No evidence of cholelithiasis or biliary dilatation.   Electronically Signed   By: Ulyses Southward M.D.   On: 04/18/2015 17:57    Scheduled Meds: . amLODipine  5 mg Oral Daily  . buPROPion  100 mg Oral BID  . enoxaparin (LOVENOX) injection  40 mg Subcutaneous Q24H  . lisinopril  40 mg Oral Daily  . LORazepam  0-4 mg Intravenous 4 times per day  . [START ON 04/20/2015] LORazepam  0-4 mg Intravenous Q12H  . nicotine  21 mg Transdermal Daily  . pantoprazole (PROTONIX) IV  40 mg Intravenous Q24H  . sodium chloride  3 mL Intravenous Q12H  . thiamine  100 mg  Intravenous Daily  . thiamine  100 mg Oral Daily   Continuous Infusions: . sodium chloride 0.9 % 1,000 mL infusion 150 mL/hr at 04/19/15 1478    Principal Problem:   Acute pancreatitis Active Problems:   Essential hypertension   Nicotine dependence   Alcohol abuse   Hypokalemia   Abnormal [place] (ultrasound) of abdomen    Time spent: 35 minutes    Mayrene Bastarache M.D. [facility] Pager [phone]. If 7PM-7AM, please contact night-coverage at [email or web address], password [facility] 04/19/2015, 11:30 AM  LOS: 2 days

## 2015-04-20 DIAGNOSIS — E872 Acidosis: Secondary | ICD-10-CM

## 2015-04-20 DIAGNOSIS — E8729 Other acidosis: Secondary | ICD-10-CM | POA: Insufficient documentation

## 2015-04-20 LAB — CBC
HCT: 39 % (ref 39.0–52.0)
HEMOGLOBIN: 13.5 g/dL (ref 13.0–17.0)
MCH: 33.6 pg (ref 26.0–34.0)
MCHC: 34.6 g/dL (ref 30.0–36.0)
MCV: 97 fL (ref 78.0–100.0)
Platelets: 156 10*3/uL (ref 150–400)
RBC: 4.02 MIL/uL — ABNORMAL LOW (ref 4.22–5.81)
RDW: 13.5 % (ref 11.5–15.5)
WBC: 6.1 10*3/uL (ref 4.0–10.5)

## 2015-04-20 LAB — COMPREHENSIVE METABOLIC PANEL
ALBUMIN: 3.7 g/dL (ref 3.5–5.0)
ALK PHOS: 64 U/L (ref 38–126)
ALT: 52 U/L (ref 17–63)
ANION GAP: 10 (ref 5–15)
AST: 37 U/L (ref 15–41)
BILIRUBIN TOTAL: 1.5 mg/dL — AB (ref 0.3–1.2)
CALCIUM: 8.8 mg/dL — AB (ref 8.9–10.3)
CO2: 26 mmol/L (ref 22–32)
Chloride: 98 mmol/L — ABNORMAL LOW (ref 101–111)
Creatinine, Ser: 0.9 mg/dL (ref 0.61–1.24)
GFR calc Af Amer: >60 mL/min (ref >60)
GLUCOSE: 98 mg/dL (ref 65–99)
Potassium: 3.5 mmol/L (ref 3.5–5.1)
Sodium: 134 mmol/L — ABNORMAL LOW (ref 135–145)
TOTAL PROTEIN: 6.6 g/dL (ref 6.5–8.1)

## 2015-04-20 LAB — LIPASE, BLOOD
LIPASE: 1670 U/L — AB (ref 22–51)
Lipase: 740 U/L — ABNORMAL HIGH (ref 22–51)

## 2015-04-20 LAB — CANCER ANTIGEN 19-9: CA 19 9: 7 U/mL (ref 0–35)

## 2015-04-20 MED ORDER — PANTOPRAZOLE SODIUM 40 MG PO TBEC
40.0000 mg | DELAYED_RELEASE_TABLET | Freq: Every day | ORAL | Status: DC
  Administered 2015-04-20 – 2015-04-22 (×2): 40 mg via ORAL
  Filled 2015-04-20 (×3): qty 1

## 2015-04-20 MED ORDER — SODIUM CHLORIDE 0.9 % IV BOLUS (SEPSIS)
1000.0000 mL | Freq: Once | INTRAVENOUS | Status: AC
  Administered 2015-04-20: 1000 mL via INTRAVENOUS

## 2015-04-20 MED ORDER — POTASSIUM CHLORIDE CRYS ER 20 MEQ PO TBCR
40.0000 meq | EXTENDED_RELEASE_TABLET | Freq: Once | ORAL | Status: AC
  Administered 2015-04-20: 40 meq via ORAL
  Filled 2015-04-20: qty 2

## 2015-04-20 NOTE — Progress Notes (Signed)
[facility] PROGRESS NOTE  Jimmy Zimmerman VWU:[id number] DOB: Mar 21, 1966 DOA: 04/17/2015 PCP: No PCP Per Patient  Assessment/Plan: #1 acute pancreatitis likely alcohol induced Patient with clinical improvement. No nausea no emesis. Abdominal pain has improved. Lipase levels trending down. Fasting lipid panel with normal triglyceride levels. Abdominal ultrasound negative for cholelithiasis however does show fatty infiltration of the liver with 2 areas of hypoechoic regions noted. No intrahepatic biliary dilatation. Continue IV fluids. Will place patient on a clear liquid diet. Supportive care. Follow.  #2 abnormal abdominal ultrasound/abnormal CT abdomen and pelvis Abdominal ultrasound showing fatty infiltration of the liver with 2 areas of hypoechoic regions noted. CT abdomen and pelvis with possible pancreatic mass, liver lesions, adrenal lesions, peripancreatic adenopathy. CA-19-9 was normal. GI has been consulted and is following. Patient will likely need outpatient follow-up with possible endoscopic ultrasound in 6-8 weeks.  #3 hypertension Continue home regimen of lisinopril and Norvasc. Follow.  #4 alcohol abuse Stable. Patient with no signs of withdrawal. Continue the Ativan withdrawal protocol. Follow.  #5 tobacco abuse Nicotine patch.  #6 hypokalemia/hyponatremia Replete. Magnesium level was elevated at 3.1. Follow.  #7 prophylaxis PPI for GI prophylaxis. Lovenox for DVT prophylaxis.  Code Status: Full Family Communication: Updated patient. No family at bedside. Disposition Plan: Home when medically stable and tolerating oral intake with resolution of abdominal pain, hopefully in 1-2 days.   Consultants:  Gastroenterology: Dr. Dulce Sellar 04/20/2015  Procedures:  Abdominal ultrasound 04/18/2015  CT abdomen and pelvis 04/19/2015  Antibiotics:  None  HPI/Subjective: Patient denies any abdominal pain. Patient tolerating current clears.   Objective: Filed Vitals:    04/20/15 1033  BP: 160/100  Pulse: 66  Temp:   Resp:     Intake/Output Summary (Last 24 hours) at 04/20/15 1243 Last data filed at 04/20/15 1209  Gross per 24 hour  Intake   3360 ml  Output    400 ml  Net   2960 ml   Filed Weights   04/18/15 0118 04/20/15 0850  Weight: 73.12 kg (161 lb 3.2 oz) 72.621 kg (160 lb 1.6 oz)    Exam:   General:  NAD  Cardiovascular: RRR  Respiratory: CTAB  Abdomen: Soft, nontender, nondistended, positive bowel sounds.  Musculoskeletal: No clubbing cyanosis or edema.  Data Reviewed: Basic Metabolic Panel:  Recent Labs Lab 04/17/15 1914 04/17/15 2224 04/18/15 0317 04/18/15 0853 04/19/15 0519 04/20/15 0300  NA 126*  --  131*  --  135 134*  K 3.0*  --  3.6  --  4.5 3.5  CL 86*  --  94*  --  104 98*  CO2 23  --  29  --  23 26  GLUCOSE 87  --  132*  --  86 98  BUN <5*  --  <5*  --  5* <5*  CREATININE 0.78  --  0.83  --  0.89 0.90  CALCIUM 9.4  --  9.1  --  8.4* 8.8*  MG  --  1.8  --  3.1*  --   --   PHOS  --   --  3.9  --   --   --    Liver Function Tests:  Recent Labs Lab 04/17/15 1914 04/18/15 0317 04/19/15 0519 04/20/15 0300  AST 60* 44* 34 37  ALT 86* 69* 54 52  ALKPHOS 65 59 58 64  BILITOT 1.2 1.4* 1.7* 1.5*  PROT 7.1 6.6 5.9* 6.6  ALBUMIN 4.3 3.7 3.4* 3.7    Recent Labs Lab 04/17/15 1914  04/18/15 0317 04/19/15 0519 04/20/15 0300  LIPASE 299* 214* 148* 1670*   No results for input(s): AMMONIA in the last 168 hours. CBC:  Recent Labs Lab 04/17/15 1914 04/18/15 0317 04/19/15 0519 04/20/15 0300  WBC 8.1 6.8 5.2 6.1  HGB 13.9 13.4 12.3* 13.5  HCT 39.4 37.2* 36.5* 39.0  MCV 94.7 94.4 100.3* 97.0  PLT 151 157 133* 156   Cardiac Enzymes: No results for input(s): CKTOTAL, CKMB, CKMBINDEX, TROPONINI in the last 168 hours. BNP (last 3 results) No results for input(s): BNP in the last 8760 hours.  ProBNP (last 3 results) No results for input(s): PROBNP in the last 8760 hours.  CBG: No results for  input(s): GLUCAP in the last 168 hours.  No results found for this or any previous visit (from the past 240 hour(s)).   Studies: US Abdomen Complete  04/18/2015   CLINICAL DATA:  Pancreatitis, history hypertension, smoking  EXAM: ULTRASOUND ABDOMEN COMPLETE  COMPARISON:  None  FINDINGS: Gallbladder: Well distended without stones or wall thickening. No pericholecystic fluid or sonographic Murphy sign.  Common bile duct: Diameter: 5 mm diameter , normal  Liver: Echogenic, likely fatty infiltration, though this can be seen with cirrhosis and certain infiltrative disorders. Hypoechoic region RIGHT lobe centrally 17 x 13 x 14 mm, nonspecific. Additional vague area of questionable hypo echogenicity at RIGHT lobe adjacent to upper pole of RIGHT kidney 3.2 x 2.0 x 2.7 cm. Hepatopetal portal venous flow. No intrahepatic biliary dilatation.  IVC: Normal appearance  Pancreas: Head suboptimally visualized due to obscuration by gas. Body and tail of pancreas grossly unremarkable.  Spleen: Normal appearance, 4.9 cm length  Right Kidney: Length: 11.1 cm. Normal morphology without mass or hydronephrosis.  Left Kidney: Length: 10.8 cm. Normal morphology without mass or hydronephrosis.  Abdominal aorta: Normal calibre at proximal and mid portions, distal portion and bifurcation obscured by bowel gas.  Other findings: No ascites.  IMPRESSION: Probable fatty infiltration of liver with a hypoechoic nodule within the RIGHT lobe 17 mm diameter and a second area of questionable hypo echogenicity near the upper pole of RIGHT kidney 3.2 cm greatest size.  Further characterization of these areas by CT or MR imaging with contrast recommended to exclude tumor.  Inadequate visualization of pancreatic head due to bowel gas, can also be assessed by CT.  No evidence of cholelithiasis or biliary dilatation.   Electronically Signed   By: Ulyses Southward M.D.   On: 04/18/2015 17:57   Ct Abdomen Pelvis W Contrast  04/19/2015   CLINICAL DATA:   Generalized abdominal pain, nausea, vomiting and diarrhea beginning 2-3 days ago.  EXAM: CT ABDOMEN AND PELVIS WITH CONTRAST  TECHNIQUE: Multidetector CT imaging of the abdomen and pelvis was performed using the standard protocol following bolus administration of intravenous contrast.  CONTRAST:  OMNIPAQUE IOHEXOL 300 MG/ML  SOLN  COMPARISON:  Ultrasound 04/18/2015  FINDINGS: Lung bases are normal.  Abdominal images demonstrate diffuse low density of the liver with multiple scattered hyperdense indeterminate masses with the largest over the posterior right lobe measuring 2 cm. There is a peripheral nodular enhancing 3.6 cm mass over the periphery of the posterior segment of the right lobe demonstrating moderate fill-in of contrast on the delayed images likely a hemangioma. The other hyperdense masses are unchanged on the delayed images.  There is an ill-defined low density over the region of the head of the pancreas which appears to be abutting and minimally displacing the adjacent first and second portions of  the duodenum. This measures approximately 3.2 x 4.5 cm. There are a few small adjacent lymph nodes. No significant attenuation of the peripancreatic fat planes. No adjacent free fluid. This mass likely arises from the pancreatic head as malignancy and focal pancreatitis would be possibilities. Is also possible, but less likely that this mass arises from the wall of the adjacent duodenum.  0.8 x 1.8 cm hyperdense mass over the right adrenal gland. 2.8 x 3.8 cm enhancing left adrenal mass.  The gallbladder, spleen and kidneys are normal. Ureters are normal. Appendix is normal. Small bowel and colon are unremarkable.  There is mild calcified plaque over the abdominal aorta and iliac arteries.  Pelvic images demonstrate mild bladder distention as the bladder is otherwise within normal. Prostate and rectum are normal.  Remaining bones and soft tissues are within normal.  IMPRESSION: Low-density ill-defined  masslike area over the head of the pancreas measuring 3.2 x 4.5 cm few small adjacent lymph nodes. 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 Would also consider focal pancreatitis with benign liver and adrenal masses. Recommend clinical correlation with amylase/lipase levels. MRI may be helpful for better characterization of the liver and adrenal masses.  3.6 cm mass over the right lobe of the liver most typical of a hemangioma, although metastatic disease is not excluded given the above findings.   Electronically Signed   By: Elberta Fortis M.D.   On: 04/19/2015 15:41   Ct Abdomen Pelvis W Contrast  04/19/2015   CLINICAL DATA:  Abdomen pain and nausea. 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  EXAM: CT ABDOMEN AND PELVIS WITH CONTRAST  TECHNIQUE: Multidetector CT imaging of the abdomen and pelvis was performed using the standard protocol following bolus administration of intravenous contrast.  CONTRAST:  OMNIPAQUE IOHEXOL 300 MG/ML  SOLN  COMPARISON:  None.  FINDINGS: The liver, spleen, pancreas, gallbladder, adrenal glands are normal. There are bilateral kidney cysts, largest in the lower pole left kidney measuring 1.6 x 2 cm. There is no hydronephrosis bilaterally. There is atherosclerosis of the abdominal aorta without aneurysmal dilatation. There is no abdominal lymphadenopathy. There is no small bowel obstruction or diverticulitis. The appendix is normal.  Images of the pelvis demonstrate Foley catheter identified in a partial fluid-filled bladder with diffuse bladder wall thickening. There is swelling and edema with air identified in the right upper thigh pelvic musculature and soft tissue consistent with recent postoperative change.  There are small bilateral pleural effusions with mild atelectasis of the posterior lung  bases. There are question mild enlarge lymph nodes in the bilateral infrahilar region, question reactive.  IMPRESSION: No acute abnormality identified in the abdomen and pelvis.  Postsurgical changes of the right upper thigh/pelvis region.  Small bilateral pleural effusions with mild atelectasis of bilateral posterior lung bases.   Electronically Signed   By: Sherian Rein M.D.   On: 04/19/2015 12:18    Scheduled Meds: . amLODipine  5 mg Oral Daily  . buPROPion  100 mg Oral BID  . enoxaparin (LOVENOX) injection  40 mg Subcutaneous Q24H  . lisinopril  40 mg Oral Daily  . [START ON 04/22/2015] LORazepam  0-4 mg Intravenous Q12H  . LORazepam  0-4 mg Intravenous 4 times per day  . nicotine  21 mg Transdermal Daily  . pantoprazole (PROTONIX) IV  40 mg Intravenous Q24H  . sodium chloride  3 mL Intravenous Q12H  . thiamine  100 mg Intravenous Daily  . thiamine  100 mg Oral Daily   Continuous Infusions: . sodium chloride 0.9 % 1,000 mL infusion 150 mL/hr at 04/20/15 1035    Principal Problem:   Acute pancreatitis Active Problems:   Essential hypertension   Nicotine dependence   Alcohol abuse   Hypokalemia   Abnormal [place] (ultrasound) of abdomen    Time spent: 35 minutes    THOMPSON,DANIEL M.D. [facility] Pager [phone]. If 7PM-7AM, please contact night-coverage at [email or web address], password [facility] 04/20/2015, 12:43 PM  LOS: 3 days

## 2015-04-20 NOTE — Consult Note (Signed)
[facility]astroenterology Consultation Note  Referring Provider:  Dr. Ramiro Harvest ([facility]) Primary Care Physician:  No PCP Per Patient  Reason for Consultation:  Abdominal pain, abnormal CT  HPI: Jimmy Zimmerman is a 49 y.o. male with history of alcohol abuse (100+ ounces beer daily), presenting with sharp post-prandial epigastric pain of three days' duration.  CT scan results as below.  Some diarrhea.  No blood in stool.  No prior pancreatitis.  No weight loss.  Pain is improving since in hospital.  Denies prior endoscopy.  Colonoscopy in 2015(?) per patient.   Past Medical History  Diagnosis Date  . Hypertension     Past Surgical History  Procedure Laterality Date  . Hemorrhoid surgery    . Right knee surgery       Prior to Admission medications   Medication Sig Start Date End Date Taking? Authorizing Provider  amLODipine (NORVASC) 5 MG tablet Take 1 tablet (5 mg total) by mouth daily. 01/26/15  Yes Doris Cheadle, MD  aspirin (ASPIRIN EC) 81 MG EC tablet Take 81 mg by mouth daily as needed for pain. Swallow whole.   Yes Historical Provider, MD  buPROPion (WELLBUTRIN) 100 MG tablet Take 1 tablet (100 mg total) by mouth 2 (two) times daily. 01/26/15  Yes Doris Cheadle, MD  cimetidine (TAGAMET) 200 MG tablet Take 200 mg by mouth daily.   Yes Historical Provider, MD  hydrochlorothiazide (HYDRODIURIL) 25 MG tablet Take 1 tablet (25 mg total) by mouth daily. 01/26/15  Yes Doris Cheadle, MD  lisinopril (PRINIVIL,ZESTRIL) 40 MG tablet Take 1 tablet (40 mg total) by mouth daily. 01/26/15  Yes Doris Cheadle, MD  simethicone (MYLICON) 125 MG chewable tablet Chew 125 mg by mouth every 6 (six) hours as needed for flatulence.   Yes Historical Provider, MD    Current Facility-Administered Medications  Medication Dose Route Frequency Provider Last Rate Last Dose  . amLODipine (NORVASC) tablet 5 mg  5 mg Oral Daily Rodolph Bong, MD   5 mg at 04/20/15 1034  . buPROPion [facility]) tablet  100 mg  100 mg Oral BID Rodolph Bong, MD   100 mg at 04/20/15 1032  . enoxaparin (LOVENOX) injection 40 mg  40 mg Subcutaneous Q24H Bobette Mo, MD   40 mg at 04/20/15 1032  . gi cocktail (Maalox,Lidocaine,Donnatal)  30 mL Oral TID PRN Rodolph Bong, MD      . HYDROmorphone (DILAUDID) injection 1 mg  1 mg Intravenous Q4H PRN Rodolph Bong, MD   1 mg at 04/20/15 0829  . lisinopril (PRINIVIL,ZESTRIL) tablet 40 mg  40 mg Oral Daily Rodolph Bong, MD   40 mg at 04/20/15 1034  . [START ON 04/22/2015] LORazepam (ATIVAN) injection 0-4 mg  0-4 mg Intravenous Q12H Bobette Mo, MD      . LORazepam (ATIVAN) injection 0-4 mg  0-4 mg Intravenous 4 times per day Leanne Chang, NP   4 mg at 04/19/15 2316  . nicotine (NICODERM CQ - dosed in mg/24 hours) patch 21 mg  21 mg Transdermal Daily Bobette Mo, MD   21 mg at 04/20/15 1033  . pantoprazole (PROTONIX) injection 40 mg  40 mg Intravenous Q24H Rodolph Bong, MD   40 mg at 04/19/15 2104  . simethicone (MYLICON) chewable tablet 120 mg  120 mg Oral Q6H PRN Ramiro Harvest V, MD      . sodium chloride 0.9 % 1,000 mL infusion   Intravenous Continuous Rodolph Bong,  MD 150 mL/hr at 04/20/15 1035    . sodium chloride 0.9 % injection 3 mL  3 mL Intravenous Q12H Bobette Mo, MD   3 mL at 04/18/15 0245  . thiamine (B-1) injection 100 mg  100 mg Intravenous Daily Lavera Guise, MD   100 mg at 04/18/15 1000  . thiamine (VITAMIN B-1) tablet 100 mg  100 mg Oral Daily Lavera Guise, MD   100 mg at 04/20/15 1032    Allergies as of 04/17/2015  . (No Known Allergies)    Family History  Problem Relation Age of Onset  . Hypertension Mother   . Diabetes Mother   . Stroke Maternal Grandmother     Social History   Social History  . Marital Status: Single    Spouse Name: N/A  . Number of Children: N/A  . Years of Education: N/A   Occupational History  . Not on file.   Social History Main Topics  . Smoking  status: Current Every Day Smoker -- 0.50 packs/day for 30 years    Types: Cigarettes  . Smokeless tobacco: Not on file  . Alcohol Use: 3.6 oz/week    6 Cans of beer per week     Comment: everyday   . Drug Use: No  . Sexual Activity: Not on file   Other Topics Concern  . Not on file   Social History Narrative    Review of Systems: ROS Dr. Robb Matar 04/18/15 reviewed and I agree  Physical Exam: Vital signs in last 24 hours: Temp:  [98 F (36.7 C)-98.5 F (36.9 C)] 98 F (36.7 C) (08/29 0614) Pulse Rate:  [66-91] 66 (08/29 1033) Resp:  [17-22] 17 (08/29 0614) BP: (123-160)/(81-100) 160/100 mmHg (08/29 1033) SpO2:  [97 %-100 %] 97 % (08/29 1033) Weight:  [72.621 kg (160 lb 1.6 oz)] 72.621 kg (160 lb 1.6 oz) (08/29 0850) Last BM Date: 04/19/15 General:   Alert,  Well-developed, well-nourished, pleasant and cooperative in NAD Head:  Normocephalic and atraumatic. Eyes:  Muddy +/- slightly icteric sclera bilaterally,  Conjunctiva pink. Ears:  Normal auditory acuity. Nose:  No deformity, discharge,  or lesions. Mouth:  No deformity or lesions.  Oropharynx pink & moist. Neck:  Supple; no masses or thyromegaly. Lungs:  Clear throughout to auscultation.   No wheezes, crackles, or rhonchi. No acute distress. Heart:  Regular rate and rhythm; no murmurs, clicks, rubs,  or gallops. Abdomen:  Soft, moderate firmness and mildly tender; present but hypoactive bowel sounds. No masses, hepatosplenomegaly or hernias noted. No peritonitis   Msk:  Symmetrical without gross deformities. Normal posture. Pulses:  Normal pulses noted. Extremities:  Without clubbing or edema. Neurologic:  Alert and  oriented x4;  grossly normal neurologically. Skin:  Intact without significant lesions or rashes. Psych:  Alert and cooperative. Normal mood, flat affect   Lab Results:  Recent Labs  04/18/15 0317 04/19/15 0519 04/20/15 0300  WBC 6.8 5.2 6.1  HGB 13.4 12.3* 13.5  HCT 37.2* 36.5* 39.0  PLT 157 133*  156   BMET  Recent Labs  04/18/15 0317 04/19/15 0519 04/20/15 0300  NA 131* 135 134*  K 3.6 4.5 3.5  CL 94* 104 98*  CO2 GLUCOSE 132* 86 98  BUN <5* 5* <5*  CREATININE 0.83 0.89 0.90  CALCIUM 9.1 8.4* 8.8*   LFT  Recent Labs  04/20/15 0300  PROT 6.6  ALBUMIN 3.7  AST 37  ALT 52  ALKPHOS 64  BILITOT  1.5*   PT/INR No results for input(s): LABPROT, INR in the last 72 hours.   Ca-19-9 normal.  Studies/Results:  CT abdomen/pelvis, mass versus inflammation head of pancreas, peripancreatic adenopathy, liver and adrenal lesions of unclear significance  Impression:  1.  Pancreatitis, clinically and via labs.  Suspect alcohol etiology. 2.  Abnormal CT abdomen (possible pancreatic mass, liver lesions, adrenal lesions, peripancreatic adenopathy).  Normal Ca 19-9.  Suspect benign focal pancreatitis, but certainly pancreatic malignancy is a well-deserved consideration.  Plan:  1.  Medically manage pancreatitis (clear liquids for now is ok, intravenous fluids, judicious analgesics). 2.  Endoscopic ultrasound in 6-8 weeks as outpatient. 3.  Case discussed with Dr. Janee Morn. 4.  Will follow.   LOS: 3 days   Kaitlen Redford M  04/20/2015, 10:46 AM  Pager [phone] If no answer or after 5 PM call [phone]

## 2015-04-21 ENCOUNTER — Inpatient Hospital Stay (HOSPITAL_COMMUNITY): Payer: Self-pay

## 2015-04-21 DIAGNOSIS — R748 Abnormal levels of other serum enzymes: Secondary | ICD-10-CM | POA: Insufficient documentation

## 2015-04-21 LAB — COMPREHENSIVE METABOLIC PANEL
ALBUMIN: 3.6 g/dL (ref 3.5–5.0)
ALT: 347 U/L — ABNORMAL HIGH (ref 17–63)
ANION GAP: 7 (ref 5–15)
AST: 671 U/L — AB (ref 15–41)
Alkaline Phosphatase: 235 U/L — ABNORMAL HIGH (ref 38–126)
CHLORIDE: 101 mmol/L (ref 101–111)
CO2: 25 mmol/L (ref 22–32)
Calcium: 8.9 mg/dL (ref 8.9–10.3)
Creatinine, Ser: 0.85 mg/dL (ref 0.61–1.24)
GFR calc Af Amer: >60 mL/min (ref >60)
GFR calc non Af Amer: >60 mL/min (ref >60)
GLUCOSE: 93 mg/dL (ref 65–99)
POTASSIUM: 3.5 mmol/L (ref 3.5–5.1)
Sodium: 133 mmol/L — ABNORMAL LOW (ref 135–145)
TOTAL PROTEIN: 6.5 g/dL (ref 6.5–8.1)
Total Bilirubin: 2.1 mg/dL — ABNORMAL HIGH (ref 0.3–1.2)

## 2015-04-21 LAB — CBC
HEMATOCRIT: 37.1 % — AB (ref 39.0–52.0)
HEMOGLOBIN: 12.6 g/dL — AB (ref 13.0–17.0)
MCH: 33.2 pg (ref 26.0–34.0)
MCHC: 34 g/dL (ref 30.0–36.0)
MCV: 97.6 fL (ref 78.0–100.0)
Platelets: 177 10*3/uL (ref 150–400)
RBC: 3.8 MIL/uL — ABNORMAL LOW (ref 4.22–5.81)
RDW: 13.5 % (ref 11.5–15.5)
WBC: 3.7 10*3/uL — AB (ref 4.0–10.5)

## 2015-04-21 LAB — LIPASE, BLOOD: Lipase: 112 U/L — ABNORMAL HIGH (ref 22–51)

## 2015-04-21 MED ORDER — POTASSIUM CHLORIDE CRYS ER 20 MEQ PO TBCR
40.0000 meq | EXTENDED_RELEASE_TABLET | Freq: Once | ORAL | Status: AC
  Administered 2015-04-21: 40 meq via ORAL
  Filled 2015-04-21: qty 2

## 2015-04-21 MED ORDER — AMLODIPINE BESYLATE 10 MG PO TABS
10.0000 mg | ORAL_TABLET | Freq: Every day | ORAL | Status: DC
  Administered 2015-04-21 – 2015-04-22 (×2): 10 mg via ORAL
  Filled 2015-04-21 (×2): qty 1

## 2015-04-21 MED ORDER — GADOBENATE DIMEGLUMINE 529 MG/ML IV SOLN
15.0000 mL | Freq: Once | INTRAVENOUS | Status: AC
  Administered 2015-04-21: 14 mL via INTRAVENOUS

## 2015-04-21 NOTE — Progress Notes (Signed)
Subjective: Pain much improved. Tolerating liquid diet.  Objective: Vital signs in last 24 hours: Temp:  [98.1 F (36.7 C)-98.5 F (36.9 C)] 98.5 F (36.9 C) (08/30 0613) Pulse Rate:  [66-89] 70 (08/30 1000) Resp:  [16-18] 17 (08/30 0613) BP: (143-165)/(83-100) 165/99 mmHg (08/30 1000) SpO2:  [97 %-100 %] 99 % (08/30 0613) Weight:  [70.58 kg (155 lb 9.6 oz)] 70.58 kg (155 lb 9.6 oz) (08/30 0500) Weight change:  Last BM Date: 04/20/15 (per pt)  PE: GEN:  NAD, bit cantankerous ABD:  Soft, mild epigastric tenderness, active bowel sounds  Lab Results: CBC    Component Value Date/Time   WBC 3.7* 04/21/2015 0302   RBC 3.80* 04/21/2015 0302   HGB 12.6* 04/21/2015 0302   HCT 37.1* 04/21/2015 0302   PLT 177 04/21/2015 0302   MCV 97.6 04/21/2015 0302   MCH 33.2 04/21/2015 0302   MCHC 34.0 04/21/2015 0302   RDW 13.5 04/21/2015 0302   LYMPHSABS 1.1 09/22/2014 1021   MONOABS 0.6 09/22/2014 1021   EOSABS 0.0 09/22/2014 1021   BASOSABS 0.0 09/22/2014 1021   CMP     Component Value Date/Time   NA 133* 04/21/2015 0302   K 3.5 04/21/2015 0302   CL 101 04/21/2015 0302   CO2 25 04/21/2015 0302   GLUCOSE 93 04/21/2015 0302   BUN <5* 04/21/2015 0302   CREATININE 0.85 04/21/2015 0302   CREATININE 1.03 09/22/2014 1021   CALCIUM 8.9 04/21/2015 0302   PROT 6.5 04/21/2015 0302   ALBUMIN 3.6 04/21/2015 0302   AST 671* 04/21/2015 0302   ALT 347* 04/21/2015 0302   ALKPHOS 235* 04/21/2015 0302   BILITOT 2.1* 04/21/2015 0302   GFRNONAA >60 04/21/2015 0302   GFRNONAA 85 09/22/2014 1021   GFRAA >60 04/21/2015 0302   GFRAA >89 09/22/2014 1021   Lipase     Component Value Date/Time   LIPASE 112* 04/21/2015 0302   Assessment:  1. Pancreatitis, clinically and via labs. Suspect alcohol etiology. 2. Abnormal CT abdomen (possible pancreatic mass, liver lesions, adrenal lesions, peripancreatic adenopathy). Normal Ca 19-9. Suspect benign focal pancreatitis, but certainly pancreatic  malignancy is a well-deserved consideration. 3.  Elevated LFTs, new-onset.  Plan:  1.  MRI/MRCP for further evaluation. 2.  Next step in management pending MRI results. 3.  Case discussed with Jimmy Zimmerman.   Jimmy Zimmerman 04/21/2015, 10:32 AM   Pager [phone] If no answer or after 5 PM call [phone]

## 2015-04-21 NOTE — Progress Notes (Signed)
[facility] PROGRESS NOTE  Jimmy Zimmerman ZOX:[id number] DOB: November 11, 1965 DOA: 04/17/2015 PCP: No PCP Per Patient  Assessment/Plan: #1 acute pancreatitis likely alcohol induced Patient with clinical improvement. No nausea no emesis. Abdominal pain has improved. Lipase levels trending down. Fasting lipid panel with normal triglyceride levels. Abdominal ultrasound negative for cholelithiasis however does show fatty infiltration of the liver with 2 areas of hypoechoic regions noted. No intrahepatic biliary dilatation. LFTs have trended up significantly with AST of 671 from 37 and ALT of 347 from 52 yesterday. Patient has been seen by GI was recommended an MRCP for further evaluation. Decrease IV fluid rates. Advanced to a soft diet. Supportive care. Follow.   #2 abnormal abdominal ultrasound/abnormal CT abdomen and pelvis/transaminitis. Abdominal ultrasound showing fatty infiltration of the liver with 2 areas of hypoechoic regions noted. CT abdomen and pelvis with possible pancreatic mass, liver lesions, adrenal lesions, peripancreatic adenopathy. CA-19-9 was normal. Patient now with a increase in his LFTs. GI recommended an MRCP today for further evaluation. GI has been consulted and is following. Patient will likely need outpatient follow-up with possible endoscopic ultrasound in 6-8 weeks if this is not done during this hospitalization.  #3 hypertension Continue home regimen of lisinopril and Norvasc. Follow.  #4 alcohol abuse Stable. Patient with no signs of withdrawal. Continue the Ativan withdrawal protocol. Follow.  #5 tobacco abuse Nicotine patch.  #6 hypokalemia/hyponatremia Replete. Magnesium level was elevated at 3.1. Follow.  #7 prophylaxis PPI for GI prophylaxis. Lovenox for DVT prophylaxis.  Code Status: Full Family Communication: Updated patient. No family at bedside. Disposition Plan: Home when medically stable and okay with GI.   Consultants:  Gastroenterology: Dr.  Dulce Sellar 04/20/2015  Procedures:  Abdominal ultrasound 04/18/2015  CT abdomen and pelvis 04/19/2015  Antibiotics:  None  HPI/Subjective: Patient denies any abdominal pain. Patient tolerating full liquids. Patient states he's feeling better. Patient asking when he can go home.  Objective: Filed Vitals:   04/21/15 1314  BP: 147/75  Pulse:   Temp:   Resp:     Intake/Output Summary (Last 24 hours) at 04/21/15 1522 Last data filed at 04/21/15 0827  Gross per 24 hour  Intake   1918 ml  Output   3575 ml  Net  -1657 ml   Filed Weights   04/18/15 0118 04/20/15 0850 04/21/15 0500  Weight: 73.12 kg (161 lb 3.2 oz) 72.621 kg (160 lb 1.6 oz) 70.58 kg (155 lb 9.6 oz)    Exam:   General:  NAD  Cardiovascular: RRR  Respiratory: CTAB  Abdomen: Soft, nontender, nondistended, positive bowel sounds.  Musculoskeletal: No clubbing cyanosis or edema.  Data Reviewed: Basic Metabolic Panel:  Recent Labs Lab 04/17/15 1914 04/17/15 2224 04/18/15 0317 04/18/15 0853 04/19/15 0519 04/20/15 0300 04/21/15 0302  NA 126*  --  131*  --  135 134* 133*  K 3.0*  --  3.6  --  4.5 3.5 3.5  CL 86*  --  94*  --  104 98* 101  CO2 23  --  29  --  GLUCOSE 87  --  132*  --  86 98 93  BUN <5*  --  <5*  --  5* <5* <5*  CREATININE 0.78  --  0.83  --  0.89 0.90 0.85  CALCIUM 9.4  --  9.1  --  8.4* 8.8* 8.9  MG  --  1.8  --  3.1*  --   --   --   PHOS  --   --  3.9  --   --   --   --    Liver Function Tests:  Recent Labs Lab 04/17/15 1914 04/18/15 0317 04/19/15 0519 04/20/15 0300 04/21/15 0302  AST 60* 44* 34 37 671*  ALT 86* 69* 54 52 347*  ALKPHOS 65 59 58 64 235*  BILITOT 1.2 1.4* 1.7* 1.5* 2.1*  PROT 7.1 6.6 5.9* 6.6 6.5  ALBUMIN 4.3 3.7 3.4* 3.7 3.6    Recent Labs Lab 04/18/15 0317 04/19/15 0519 04/20/15 0300 04/20/15 1324 04/21/15 0302  LIPASE 214* 148* 1670* 740* 112*   No results for input(s): AMMONIA in the last 168 hours. CBC:  Recent Labs Lab  04/17/15 1914 04/18/15 0317 04/19/15 0519 04/20/15 0300 04/21/15 0302  WBC 8.1 6.8 5.2 6.1 3.7*  HGB 13.9 13.4 12.3* 13.5 12.6*  HCT 39.4 37.2* 36.5* 39.0 37.1*  MCV 94.7 94.4 100.3* 97.0 97.6  PLT 151 157 133* 156 177   Cardiac Enzymes: No results for input(s): CKTOTAL, CKMB, CKMBINDEX, TROPONINI in the last 168 hours. BNP (last 3 results) No results for input(s): BNP in the last 8760 hours.  ProBNP (last 3 results) No results for input(s): PROBNP in the last 8760 hours.  CBG: No results for input(s): GLUCAP in the last 168 hours.  No results found for this or any previous visit (from the past 240 hour(s)).   Studies: No results found.  Scheduled Meds: . amLODipine  10 mg Oral Daily  . buPROPion  100 mg Oral BID  . enoxaparin (LOVENOX) injection  40 mg Subcutaneous Q24H  . lisinopril  40 mg Oral Daily  . [START ON 04/22/2015] LORazepam  0-4 mg Intravenous Q12H  . LORazepam  0-4 mg Intravenous 4 times per day  . nicotine  21 mg Transdermal Daily  . pantoprazole  40 mg Oral Q1200  . sodium chloride  3 mL Intravenous Q12H  . thiamine  100 mg Oral Daily   Continuous Infusions: . sodium chloride 0.9 % 1,000 mL infusion 125 mL/hr at 04/21/15 1332    Principal Problem:   Acute pancreatitis Active Problems:   Essential hypertension   Nicotine dependence   Alcohol abuse   Hypokalemia   Abnormal [place] (ultrasound) of abdomen   Alcoholic ketoacidosis    Time spent: 35 minutes    Nanci Lakatos M.D. [facility] Pager [phone]. If 7PM-7AM, please contact night-coverage at [email or web address], password [facility] 04/21/2015, 3:22 PM  LOS: 4 days

## 2015-04-22 DIAGNOSIS — K852 Alcohol induced acute pancreatitis: Principal | ICD-10-CM

## 2015-04-22 DIAGNOSIS — F1010 Alcohol abuse, uncomplicated: Secondary | ICD-10-CM

## 2015-04-22 LAB — COMPREHENSIVE METABOLIC PANEL
ALBUMIN: 3.7 g/dL (ref 3.5–5.0)
ALK PHOS: 177 U/L — AB (ref 38–126)
ALT: 257 U/L — AB (ref 17–63)
ANION GAP: 10 (ref 5–15)
AST: 146 U/L — ABNORMAL HIGH (ref 15–41)
BUN: <5 mg/dL — ABNORMAL LOW (ref 6–20)
CALCIUM: 9.4 mg/dL (ref 8.9–10.3)
CHLORIDE: 102 mmol/L (ref 101–111)
CO2: 25 mmol/L (ref 22–32)
Creatinine, Ser: 0.91 mg/dL (ref 0.61–1.24)
GFR calc non Af Amer: >60 mL/min (ref >60)
GLUCOSE: 96 mg/dL (ref 65–99)
Potassium: 3.6 mmol/L (ref 3.5–5.1)
SODIUM: 137 mmol/L (ref 135–145)
Total Bilirubin: 1.7 mg/dL — ABNORMAL HIGH (ref 0.3–1.2)
Total Protein: 6.8 g/dL (ref 6.5–8.1)

## 2015-04-22 LAB — CBC
HEMATOCRIT: 38.2 % — AB (ref 39.0–52.0)
HEMOGLOBIN: 13 g/dL (ref 13.0–17.0)
MCH: 33.2 pg (ref 26.0–34.0)
MCHC: 34 g/dL (ref 30.0–36.0)
MCV: 97.7 fL (ref 78.0–100.0)
Platelets: 210 10*3/uL (ref 150–400)
RBC: 3.91 MIL/uL — AB (ref 4.22–5.81)
RDW: 13.6 % (ref 11.5–15.5)
WBC: 5.6 10*3/uL (ref 4.0–10.5)

## 2015-04-22 LAB — LIPASE, BLOOD: Lipase: 102 U/L — ABNORMAL HIGH (ref 22–51)

## 2015-04-22 MED ORDER — PANTOPRAZOLE SODIUM 40 MG PO TBEC: 40.0000 mg | DELAYED_RELEASE_TABLET | Freq: Every day | ORAL | Status: DC

## 2015-04-22 NOTE — Progress Notes (Signed)
Subjective: No further abdominal pain. Tolerating solid diet. Wants to go home.  Objective: Vital signs in last 24 hours: Temp:  [98.3 F (36.8 C)-98.7 F (37.1 C)] 98.7 F (37.1 C) (08/31 0614) Pulse Rate:  [72-108] 108 (08/31 0941) Resp:  [17-18] 17 (08/31 0614) BP: (134-163)/(72-97) 135/90 mmHg (08/31 0941) SpO2:  [98 %-100 %] 98 % (08/31 0614) Weight:  [71.8 kg (158 lb 4.6 oz)] 71.8 kg (158 lb 4.6 oz) (08/31 0614) Weight change: -0.821 kg (-1 lb 13 oz) Last BM Date: 04/20/15  PE: GEN:  NAD, flat affect  Lab Results: CMP     Component Value Date/Time   NA 137 04/22/2015 0400   K 3.6 04/22/2015 0400   CL 102 04/22/2015 0400   CO2 25 04/22/2015 0400   GLUCOSE 96 04/22/2015 0400   BUN <5* 04/22/2015 0400   CREATININE 0.91 04/22/2015 0400   CREATININE 1.03 09/22/2014 1021   CALCIUM 9.4 04/22/2015 0400   PROT 6.8 04/22/2015 0400   ALBUMIN 3.7 04/22/2015 0400   AST 146* 04/22/2015 0400   ALT 257* 04/22/2015 0400   ALKPHOS 177* 04/22/2015 0400   BILITOT 1.7* 04/22/2015 0400   GFRNONAA >60 04/22/2015 0400   GFRNONAA 85 09/22/2014 1021   GFRAA >60 04/22/2015 0400   GFRAA >89 09/22/2014 1021   Lipase     Component Value Date/Time   LIPASE 102* 04/22/2015 0400   CBC    Component Value Date/Time   WBC 5.6 04/22/2015 0400   RBC 3.91* 04/22/2015 0400   HGB 13.0 04/22/2015 0400   HCT 38.2* 04/22/2015 0400   PLT 210 04/22/2015 0400   MCV 97.7 04/22/2015 0400   MCH 33.2 04/22/2015 0400   MCHC 34.0 04/22/2015 0400   RDW 13.6 04/22/2015 0400   LYMPHSABS 1.1 09/22/2014 1021   MONOABS 0.6 09/22/2014 1021   EOSABS 0.0 09/22/2014 1021   BASOSABS 0.0 09/22/2014 1021   Studies/Results: Mr 3d Recon At Scanner  04/21/2015   CLINICAL DATA:  Elevated LFTs.  EtOH pancreatitis.  Abnormal CT.  EXAM: MRI ABDOMEN WITHOUT AND WITH CONTRAST (INCLUDING MRCP)  TECHNIQUE: Multiplanar multisequence MR imaging of the abdomen was performed both before and after the administration of  intravenous contrast. Heavily T2-weighted images of the biliary and pancreatic ducts were obtained, and three-dimensional MRCP images were rendered by post processing.  CONTRAST:  14mL MULTIHANCE GADOBENATE DIMEGLUMINE 529 MG/ML IV SOLN  COMPARISON:  CT 04/19/2015  FINDINGS: Lower chest:  Lung bases are clear.  Hepatobiliary: Multiple well-circumscribed lesions in the LEFT and RIGHT hepatic lobe which are hyperintense on T2 weighted imaging (series 4). These hepatic lesions demonstrate early arterial enhancement, most uniform and one peripheral. These lesions also demonstrate persistent enhancement on the more delayed series. These findings are most consistent with multiple benign hemangiomas. Largest hemangiomas in the posterior RIGHT hepatic lobe measuring 3 cm on image 27, series 1904. Smaller 1.2 cm hemangioma in the lateral LEFT hepatic lobe and in the dome the liver a 1.7 cm example lesion.  There is marked dropout of signal on opposed phase imaging within liver parenchyma (series 1701 and 1702). No biliary duct dilatation. The gallbladder is normal. The common bile duct is normal caliber. New gallstones within the lumen gallbladder or within the common bile duct  Pancreas: The pancreatic parenchyma is indistinct at the pancreatic head. The uncinate body and tail of pancreas have normal signal intensity on T1 weighted imaging. There is uniform enhancement of the pancreas on the contrast enhanced series (image series  1901). There is no organized fluid collections. No pancreatic duct dilatation.  Spleen: Normal spleen.  Adrenals/urinary tract: There is marked loss of signal intensity within the LEFT adrenal mass seen on images 26 of series 702. This LEFT adrenal mass measures 28 x 32 mm on image 24 series 1901. The RIGHT adrenal gland is normal. Normal kidneys.  Stomach/Bowel: Stomach and limited of the small bowel is unremarkable  Vascular/Lymphatic: Abdominal aortic normal caliber. No retroperitoneal  periportal lymphadenopathy.  Musculoskeletal: No aggressive osseous lesion  IMPRESSION: 1. Focal pancreatitis in the head of the pancreas. No pancreatic mass lesion identified. No duct dilatation. No organized fluid collections. Common bile duct is normal caliber. No cholelithiasis or choledocholithiasis. 2. Marked hepatic steatosis. 3. Multiple benign hemangiomas within the LEFT and RIGHT hepatic lobe. 4. Large LEFT adrenal adenoma measures up to 3.5 cm.   Electronically Signed   By: Genevive Bi M.D.   On: 04/21/2015 17:52   Mr Abd W/wo Cm/mrcp  04/21/2015   CLINICAL DATA:  Elevated LFTs.  EtOH pancreatitis.  Abnormal CT.  EXAM: MRI ABDOMEN WITHOUT AND WITH CONTRAST (INCLUDING MRCP)  TECHNIQUE: Multiplanar multisequence MR imaging of the abdomen was performed both before and after the administration of intravenous contrast. Heavily T2-weighted images of the biliary and pancreatic ducts were obtained, and three-dimensional MRCP images were rendered by post processing.  CONTRAST:  14mL MULTIHANCE GADOBENATE DIMEGLUMINE 529 MG/ML IV SOLN  COMPARISON:  CT 04/19/2015  FINDINGS: Lower chest:  Lung bases are clear.  Hepatobiliary: Multiple well-circumscribed lesions in the LEFT and RIGHT hepatic lobe which are hyperintense on T2 weighted imaging (series 4). These hepatic lesions demonstrate early arterial enhancement, most uniform and one peripheral. These lesions also demonstrate persistent enhancement on the more delayed series. These findings are most consistent with multiple benign hemangiomas. Largest hemangiomas in the posterior RIGHT hepatic lobe measuring 3 cm on image 27, series 1904. Smaller 1.2 cm hemangioma in the lateral LEFT hepatic lobe and in the dome the liver a 1.7 cm example lesion.  There is marked dropout of signal on opposed phase imaging within liver parenchyma (series 1701 and 1702). No biliary duct dilatation. The gallbladder is normal. The common bile duct is normal caliber. New  gallstones within the lumen gallbladder or within the common bile duct  Pancreas: The pancreatic parenchyma is indistinct at the pancreatic head. The uncinate body and tail of pancreas have normal signal intensity on T1 weighted imaging. There is uniform enhancement of the pancreas on the contrast enhanced series (image series 1901). There is no organized fluid collections. No pancreatic duct dilatation.  Spleen: Normal spleen.  Adrenals/urinary tract: There is marked loss of signal intensity within the LEFT adrenal mass seen on images 26 of series 702. This LEFT adrenal mass measures 28 x 32 mm on image 24 series 1901. The RIGHT adrenal gland is normal. Normal kidneys.  Stomach/Bowel: Stomach and limited of the small bowel is unremarkable  Vascular/Lymphatic: Abdominal aortic normal caliber. No retroperitoneal periportal lymphadenopathy.  Musculoskeletal: No aggressive osseous lesion  IMPRESSION: 1. Focal pancreatitis in the head of the pancreas. No pancreatic mass lesion identified. No duct dilatation. No organized fluid collections. Common bile duct is normal caliber. No cholelithiasis or choledocholithiasis. 2. Marked hepatic steatosis. 3. Multiple benign hemangiomas within the LEFT and RIGHT hepatic lobe. 4. Large LEFT adrenal adenoma measures up to 3.5 cm.   Electronically Signed   By: Genevive Bi M.D.   On: 04/21/2015 17:52   Assessment:  1. Pancreatitis, clinically  and via labs. Suspect alcohol etiology.  MRI also supportive of pancreatitis (and not pancreatic mass). 2. Abnormal CT abdomen (possible pancreatic mass, liver lesions, adrenal lesions, peripancreatic adenopathy). Normal Ca 19-9. Suspect benign focal pancreatitis, but certainly pancreatic malignancy is a well-deserved consideration. 3. Elevated LFTs, new-onset, now down-trending, no bile duct obstruction, bile duct stone/mass or pancreatic mass seen.  Plan:  1.  OK from GI perspective for patient to go home on low fat  diet. 2.  Would emphasize importance of alcohol cessation, and provide rehabilitation options for him upon discharge. 3.  Will need outpatient follow-up with me (Dr. Dulce Sellar, [facility] [phone]) in 4-6 weeks and will ultimately need outpatient endoscopic ultrasound for further assessment of pancreas after resolution of this more acute inflammatory process. 4.  Will sign-off; please call with questions; thank you for the consultation.   Freddy Jaksch 04/22/2015, 11:22 AM   Pager [phone] If no answer or after 5 PM call [phone]

## 2015-04-22 NOTE — Discharge Summary (Signed)
Physician Discharge Summary  Jimmy Zimmerman YTK:[id number] DOB: Nov 20, 1965 DOA: 04/17/2015  PCP: No PCP Per Patient  Admit date: 04/17/2015 Discharge date: 04/22/2015  Time spent: > 35 minutes  Recommendations for Outpatient Follow-up:  1. Pt will need follow up with GI physician as indicated below. 2. Please review imaging results below and in EMR. GI following 3. Pt will require alcohol cessation 4. Encourage low fat diet  Discharge Diagnoses:  Principal Problem:   Acute pancreatitis Active Problems:   Essential hypertension   Nicotine dependence   Alcohol abuse   Hypokalemia   Abnormal [place] (ultrasound) of abdomen   Alcoholic ketoacidosis   Elevated liver enzymes   Discharge Condition: stable  Diet recommendation: low fat diet  Filed Weights   04/20/15 0850 04/21/15 0500 04/22/15 0614  Weight: 72.621 kg (160 lb 1.6 oz) 70.58 kg (155 lb 9.6 oz) 71.8 kg (158 lb 4.6 oz)    History of present illness:  49 y/o with HTN who presented with acute alcoholic pancreatitis.  Hospital Course:  #1 acute pancreatitis likely alcohol induced Patient with clinical improvement. No nausea no emesis. Abdominal pain has improved. Lipase levels trending down. Fasting lipid panel with normal triglyceride levels. Abdominal ultrasound negative for cholelithiasis however does show fatty infiltration of the liver with 2 areas of hypoechoic regions noted.  MR of abdomen MRCP obtained. GI physician has cleared patient for discharge with hospital f/u as listed below.  #2 abnormal abdominal ultrasound/abnormal CT abdomen and pelvis/transaminitis. Abdominal ultrasound showing fatty infiltration of the liver with 2 areas of hypoechoic regions noted. CT abdomen and pelvis with possible pancreatic mass, liver lesions, adrenal lesions, peripancreatic adenopathy. CA-19-9 was normal. Patient now with a increase in his LFTs. GI recommended an MRCP today for further evaluation results/impressions as  follow:  IMPRESSION: 1. Focal pancreatitis in the head of the pancreas. No pancreatic mass lesion identified. No duct dilatation. No organized fluid collections. Common bile duct is normal caliber. No cholelithiasis or choledocholithiasis. 2. Marked hepatic steatosis. 3. Multiple benign hemangiomas within the LEFT and RIGHT hepatic lobe. 4. Large LEFT adrenal adenoma measures up to 3.5 cm.  #3 hypertension Continue home regimen of lisinopril and Norvasc. Follow.  #4 alcohol abuse Stable. Patient with no signs of withdrawal.  - consulted social worker so that patient may obtain more information on alcohol cessation programs as outpatient.  #5 tobacco abuse Nicotine patch.  #6 hypokalemia/hyponatremia resolved   Procedures:  Please refer to imaging study results below  Consultations:  GI: Dr. Dulce Sellar  Discharge Exam: Filed Vitals:   04/22/15 0941  BP: 135/90  Pulse: 108  Temp:   Resp:     General: Pt in nad, alert and awake Cardiovascular: rrr, no mrg Respiratory: cta bl, no wheezes  Discharge Instructions   Discharge Instructions    Call MD for:  redness, tenderness, or signs of infection (pain, swelling, redness, odor or green/yellow discharge around incision site)    Complete by:  As directed      Call MD for:  severe uncontrolled pain    Complete by:  As directed      Call MD for:  temperature >100.4    Complete by:  As directed      Diet - low sodium heart healthy    Complete by:  As directed      Discharge instructions    Complete by:  As directed   Will need outpatient follow-up with me (Dr. [facility] [phone]) in 4-6 weeks and  will ultimately need outpatient endoscopic ultrasound for further assessment of pancreas after resolution of this more acute inflammatory process.  Please avoid any alcohol ingestion. Also be sure to eat a low fat diet.     Increase activity slowly    Complete by:  As directed           Current  Discharge Medication List    START taking these medications   Details  pantoprazole (PROTONIX) 40 MG tablet Take 1 tablet (40 mg total) by mouth daily at 12 noon. Qty: 30 tablet, Refills: 0      CONTINUE these medications which have NOT CHANGED   Details  amLODipine (NORVASC) 5 MG tablet Take 1 tablet (5 mg total) by mouth daily. Qty: 30 tablet, Refills: 3   Associated Diagnoses: Essential hypertension    buPROPion (WELLBUTRIN) 100 MG tablet Take 1 tablet (100 mg total) by mouth 2 (two) times daily. Qty: 60 tablet, Refills: 3   Associated Diagnoses: Nicotine dependence, uncomplicated, unspecified nicotine product type    lisinopril (PRINIVIL,ZESTRIL) 40 MG tablet Take 1 tablet (40 mg total) by mouth daily. Qty: 30 tablet, Refills: 3   Associated Diagnoses: Essential hypertension    simethicone (MYLICON) 125 MG chewable tablet Chew 125 mg by mouth every 6 (six) hours as needed for flatulence.      STOP taking these medications     aspirin (ASPIRIN EC) 81 MG EC tablet      cimetidine (TAGAMET) 200 MG tablet      hydrochlorothiazide (HYDRODIURIL) 25 MG tablet        No Known Allergies    The results of significant diagnostics from this hospitalization (including imaging, microbiology, ancillary and laboratory) are listed below for reference.    Significant Diagnostic Studies: US Abdomen Complete  04/18/2015   CLINICAL DATA:  Pancreatitis, history hypertension, smoking  EXAM: ULTRASOUND ABDOMEN COMPLETE  COMPARISON:  None  FINDINGS: Gallbladder: Well distended without stones or wall thickening. No pericholecystic fluid or sonographic Murphy sign.  Common bile duct: Diameter: 5 mm diameter , normal  Liver: Echogenic, likely fatty infiltration, though this can be seen with cirrhosis and certain infiltrative disorders. Hypoechoic region RIGHT lobe centrally 17 x 13 x 14 mm, nonspecific. Additional vague area of questionable hypo echogenicity at RIGHT lobe adjacent to upper pole of  RIGHT kidney 3.2 x 2.0 x 2.7 cm. Hepatopetal portal venous flow. No intrahepatic biliary dilatation.  IVC: Normal appearance  Pancreas: Head suboptimally visualized due to obscuration by gas. Body and tail of pancreas grossly unremarkable.  Spleen: Normal appearance, 4.9 cm length  Right Kidney: Length: 11.1 cm. Normal morphology without mass or hydronephrosis.  Left Kidney: Length: 10.8 cm. Normal morphology without mass or hydronephrosis.  Abdominal aorta: Normal calibre at proximal and mid portions, distal portion and bifurcation obscured by bowel gas.  Other findings: No ascites.  IMPRESSION: Probable fatty infiltration of liver with a hypoechoic nodule within the RIGHT lobe 17 mm diameter and a second area of questionable hypo echogenicity near the upper pole of RIGHT kidney 3.2 cm greatest size.  Further characterization of these areas by CT or MR imaging with contrast recommended to exclude tumor.  Inadequate visualization of pancreatic head due to bowel gas, can also be assessed by CT.  No evidence of cholelithiasis or biliary dilatation.   Electronically Signed   By: Ulyses Southward M.D.   On: 04/18/2015 17:57   Ct Abdomen Pelvis W Contrast  04/19/2015   CLINICAL DATA:  Generalized abdominal pain, nausea,  vomiting and diarrhea beginning 2-3 days ago.  EXAM: CT ABDOMEN AND PELVIS WITH CONTRAST  TECHNIQUE: Multidetector CT imaging of the abdomen and pelvis was performed using the standard protocol following bolus administration of intravenous contrast.  CONTRAST:  OMNIPAQUE IOHEXOL 300 MG/ML  SOLN  COMPARISON:  Ultrasound 04/18/2015  FINDINGS: Lung bases are normal.  Abdominal images demonstrate diffuse low density of the liver with multiple scattered hyperdense indeterminate masses with the largest over the posterior right lobe measuring 2 cm. There is a peripheral nodular enhancing 3.6 cm mass over the periphery of the posterior segment of the right lobe demonstrating moderate fill-in of contrast on  the delayed images likely a hemangioma. The other hyperdense masses are unchanged on the delayed images.  There is an ill-defined low density over the region of the head of the pancreas which appears to be abutting and minimally displacing the adjacent first and second portions of the duodenum. This measures approximately 3.2 x 4.5 cm. There are a few small adjacent lymph nodes. No significant attenuation of the peripancreatic fat planes. No adjacent free fluid. This mass likely arises from the pancreatic head as malignancy and focal pancreatitis would be possibilities. Is also possible, but less likely that this mass arises from the wall of the adjacent duodenum.  0.8 x 1.8 cm hyperdense mass over the right adrenal gland. 2.8 x 3.8 cm enhancing left adrenal mass.  The gallbladder, spleen and kidneys are normal. Ureters are normal. Appendix is normal. Small bowel and colon are unremarkable.  There is mild calcified plaque over the abdominal aorta and iliac arteries.  Pelvic images demonstrate mild bladder distention as the bladder is otherwise within normal. Prostate and rectum are normal.  Remaining bones and soft tissues are within normal.  IMPRESSION: Low-density ill-defined masslike area over the head of the pancreas measuring 3.2 x 4.5 cm few small adjacent lymph nodes. 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 Would also consider focal pancreatitis with benign liver and adrenal masses. Recommend clinical correlation with amylase/lipase levels. MRI may be helpful for better characterization of the liver and adrenal masses.  3.6 cm mass over the right lobe of the liver most typical of a hemangioma, although metastatic disease is not excluded given the above findings.   Electronically Signed   By: Elberta Fortis M.D.   On: 04/19/2015  15:41   Ct Abdomen Pelvis W Contrast  04/19/2015   CLINICAL DATA:  Abdomen pain and nausea. 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  EXAM: CT ABDOMEN AND PELVIS WITH CONTRAST  TECHNIQUE: Multidetector CT imaging of the abdomen and pelvis was performed using the standard protocol following bolus administration of intravenous contrast.  CONTRAST:  OMNIPAQUE IOHEXOL 300 MG/ML  SOLN  COMPARISON:  None.  FINDINGS: The liver, spleen, pancreas, gallbladder, adrenal glands are normal. There are bilateral kidney cysts, largest in the lower pole left kidney measuring 1.6 x 2 cm. There is no hydronephrosis bilaterally. There is atherosclerosis of the abdominal aorta without aneurysmal dilatation. There is no abdominal lymphadenopathy. There is no small bowel obstruction or diverticulitis. The appendix is normal.  Images of the pelvis demonstrate Foley catheter identified in a partial fluid-filled bladder with diffuse bladder wall thickening. There is swelling and edema with air identified in the right upper thigh pelvic musculature and soft tissue consistent with recent postoperative change.  There are  small bilateral pleural effusions with mild atelectasis of the posterior lung bases. There are question mild enlarge lymph nodes in the bilateral infrahilar region, question reactive.  IMPRESSION: No acute abnormality identified in the abdomen and pelvis.  Postsurgical changes of the right upper thigh/pelvis region.  Small bilateral pleural effusions with mild atelectasis of bilateral posterior lung bases.   Electronically Signed   By: Sherian Rein M.D.   On: 04/19/2015 12:18   Mr 3d Recon At Scanner  04/21/2015   CLINICAL DATA:  Elevated LFTs.  EtOH pancreatitis.  Abnormal CT.  EXAM: MRI ABDOMEN WITHOUT AND WITH CONTRAST (INCLUDING MRCP)  TECHNIQUE: Multiplanar multisequence MR imaging of the abdomen was performed both before and after the administration of intravenous contrast. Heavily T2-weighted images of the  biliary and pancreatic ducts were obtained, and three-dimensional MRCP images were rendered by post processing.  CONTRAST:  14mL MULTIHANCE GADOBENATE DIMEGLUMINE 529 MG/ML IV SOLN  COMPARISON:  CT 04/19/2015  FINDINGS: Lower chest:  Lung bases are clear.  Hepatobiliary: Multiple well-circumscribed lesions in the LEFT and RIGHT hepatic lobe which are hyperintense on T2 weighted imaging (series 4). These hepatic lesions demonstrate early arterial enhancement, most uniform and one peripheral. These lesions also demonstrate persistent enhancement on the more delayed series. These findings are most consistent with multiple benign hemangiomas. Largest hemangiomas in the posterior RIGHT hepatic lobe measuring 3 cm on image 27, series 1904. Smaller 1.2 cm hemangioma in the lateral LEFT hepatic lobe and in the dome the liver a 1.7 cm example lesion.  There is marked dropout of signal on opposed phase imaging within liver parenchyma (series 1701 and 1702). No biliary duct dilatation. The gallbladder is normal. The common bile duct is normal caliber. New gallstones within the lumen gallbladder or within the common bile duct  Pancreas: The pancreatic parenchyma is indistinct at the pancreatic head. The uncinate body and tail of pancreas have normal signal intensity on T1 weighted imaging. There is uniform enhancement of the pancreas on the contrast enhanced series (image series 1901). There is no organized fluid collections. No pancreatic duct dilatation.  Spleen: Normal spleen.  Adrenals/urinary tract: There is marked loss of signal intensity within the LEFT adrenal mass seen on images 26 of series 702. This LEFT adrenal mass measures 28 x 32 mm on image 24 series 1901. The RIGHT adrenal gland is normal. Normal kidneys.  Stomach/Bowel: Stomach and limited of the small bowel is unremarkable  Vascular/Lymphatic: Abdominal aortic normal caliber. No retroperitoneal periportal lymphadenopathy.  Musculoskeletal: No aggressive  osseous lesion  IMPRESSION: 1. Focal pancreatitis in the head of the pancreas. No pancreatic mass lesion identified. No duct dilatation. No organized fluid collections. Common bile duct is normal caliber. No cholelithiasis or choledocholithiasis. 2. Marked hepatic steatosis. 3. Multiple benign hemangiomas within the LEFT and RIGHT hepatic lobe. 4. Large LEFT adrenal adenoma measures up to 3.5 cm.   Electronically Signed   By: Genevive Bi M.D.   On: 04/21/2015 17:52   Mr Abd W/wo Cm/mrcp  04/21/2015   CLINICAL DATA:  Elevated LFTs.  EtOH pancreatitis.  Abnormal CT.  EXAM: MRI ABDOMEN WITHOUT AND WITH CONTRAST (INCLUDING MRCP)  TECHNIQUE: Multiplanar multisequence MR imaging of the abdomen was performed both before and after the administration of intravenous contrast. Heavily T2-weighted images of the biliary and pancreatic ducts were obtained, and three-dimensional MRCP images were rendered by post processing.  CONTRAST:  14mL MULTIHANCE GADOBENATE DIMEGLUMINE 529 MG/ML IV SOLN  COMPARISON:  CT 04/19/2015  FINDINGS: Lower chest:  Lung bases are clear.  Hepatobiliary: Multiple well-circumscribed lesions in the LEFT and RIGHT hepatic lobe which are hyperintense on T2 weighted imaging (series 4). These hepatic lesions demonstrate early arterial enhancement, most uniform and one peripheral. These lesions also demonstrate persistent enhancement on the more delayed series. These findings are most consistent with multiple benign hemangiomas. Largest hemangiomas in the posterior RIGHT hepatic lobe measuring 3 cm on image 27, series 1904. Smaller 1.2 cm hemangioma in the lateral LEFT hepatic lobe and in the dome the liver a 1.7 cm example lesion.  There is marked dropout of signal on opposed phase imaging within liver parenchyma (series 1701 and 1702). No biliary duct dilatation. The gallbladder is normal. The common bile duct is normal caliber. New gallstones within the lumen gallbladder or within the common bile  duct  Pancreas: The pancreatic parenchyma is indistinct at the pancreatic head. The uncinate body and tail of pancreas have normal signal intensity on T1 weighted imaging. There is uniform enhancement of the pancreas on the contrast enhanced series (image series 1901). There is no organized fluid collections. No pancreatic duct dilatation.  Spleen: Normal spleen.  Adrenals/urinary tract: There is marked loss of signal intensity within the LEFT adrenal mass seen on images 26 of series 702. This LEFT adrenal mass measures 28 x 32 mm on image 24 series 1901. The RIGHT adrenal gland is normal. Normal kidneys.  Stomach/Bowel: Stomach and limited of the small bowel is unremarkable  Vascular/Lymphatic: Abdominal aortic normal caliber. No retroperitoneal periportal lymphadenopathy.  Musculoskeletal: No aggressive osseous lesion  IMPRESSION: 1. Focal pancreatitis in the head of the pancreas. No pancreatic mass lesion identified. No duct dilatation. No organized fluid collections. Common bile duct is normal caliber. No cholelithiasis or choledocholithiasis. 2. Marked hepatic steatosis. 3. Multiple benign hemangiomas within the LEFT and RIGHT hepatic lobe. 4. Large LEFT adrenal adenoma measures up to 3.5 cm.   Electronically Signed   By: Genevive Bi M.D.   On: 04/21/2015 17:52    Microbiology: No results found for this or any previous visit (from the past 240 hour(s)).   Labs: Basic Metabolic Panel:  Recent Labs Lab 04/17/15 2224 04/18/15 0317 04/18/15 0853 04/19/15 0519 04/20/15 0300 04/21/15 0302 04/22/15 0400  NA  --  131*  --  135 134* 133* 137  K  --  3.6  --  4.5 3.5 3.5 3.6  CL  --  94*  --  104 98* 101 102  CO2  --  29  --  23 26 25 25   GLUCOSE  --  132*  --  86 98 93 96  BUN  --  <5*  --  5* <5* <5* <5*  CREATININE  --  0.83  --  0.89 0.90 0.85 0.91  CALCIUM  --  9.1  --  8.4* 8.8* 8.9 9.4  MG 1.8  --  3.1*  --   --   --   --   PHOS  --  3.9  --   --   --   --   --    Liver Function  Tests:  Recent Labs Lab 04/18/15 0317 04/19/15 0519 04/20/15 0300 04/21/15 0302 04/22/15 0400  AST 44* 34 37 671* 146*  ALT 69* 54 52 347* 257*  ALKPHOS 59 58 64 235* 177*  BILITOT 1.4* 1.7* 1.5* 2.1* 1.7*  PROT 6.6 5.9* 6.6 6.5 6.8  ALBUMIN 3.7 3.4* 3.7 3.6 3.7    Recent Labs Lab 04/19/15 0519 04/20/15 0300 04/20/15 1324  04/21/15 0302 04/22/15 0400  LIPASE 148* 1670* 740* 112* 102*   No results for input(s): AMMONIA in the last 168 hours. CBC:  Recent Labs Lab 04/18/15 0317 04/19/15 0519 04/20/15 0300 04/21/15 0302 04/22/15 0400  WBC 6.8 5.2 6.1 3.7* 5.6  HGB 13.4 12.3* 13.5 12.6* 13.0  HCT 37.2* 36.5* 39.0 37.1* 38.2*  MCV 94.4 100.3* 97.0 97.6 97.7  PLT 157 133* 156 177 210   Cardiac Enzymes: No results for input(s): CKTOTAL, CKMB, CKMBINDEX, TROPONINI in the last 168 hours. BNP: BNP (last 3 results) No results for input(s): BNP in the last 8760 hours.  ProBNP (last 3 results) No results for input(s): PROBNP in the last 8760 hours.  CBG: No results for input(s): GLUCAP in the last 168 hours.     Signed:  Penny Pia  [facility] 04/22/2015, 12:13 PM

## 2015-04-22 NOTE — Progress Notes (Signed)
Patient asked could he walk the halls and go off the unit down the hall.  I gave him permission to go off the unit but just down the hall and back.  Patient ended up down stairs on the second floor and a volunteer brought him back up.  Instructed patient that he could no longer leave the unit for walks.

## 2015-05-18 ENCOUNTER — Ambulatory Visit: Payer: Self-pay | Admitting: Internal Medicine

## 2015-05-22 ENCOUNTER — Emergency Department (HOSPITAL_COMMUNITY)
Admission: EM | Admit: 2015-05-22 | Discharge: 2015-05-22 | Disposition: A | Discharge status: Home / Self Care | Payer: Self-pay | Attending: Emergency Medicine | Admitting: Emergency Medicine

## 2015-05-22 ENCOUNTER — Encounter (HOSPITAL_COMMUNITY): Payer: Self-pay | Admitting: Emergency Medicine

## 2015-05-22 DIAGNOSIS — I10 Essential (primary) hypertension: Secondary | ICD-10-CM | POA: Insufficient documentation

## 2015-05-22 DIAGNOSIS — K859 Acute pancreatitis, unspecified: Secondary | ICD-10-CM | POA: Insufficient documentation

## 2015-05-22 DIAGNOSIS — Z79899 Other long term (current) drug therapy: Secondary | ICD-10-CM | POA: Insufficient documentation

## 2015-05-22 DIAGNOSIS — Z720 Tobacco use: Secondary | ICD-10-CM | POA: Insufficient documentation

## 2015-05-22 LAB — COMPREHENSIVE METABOLIC PANEL
ALK PHOS: 62 U/L (ref 38–126)
ALT: 41 U/L (ref 17–63)
AST: 38 U/L (ref 15–41)
Albumin: 4.7 g/dL (ref 3.5–5.0)
Anion gap: 14 (ref 5–15)
CALCIUM: 9.6 mg/dL (ref 8.9–10.3)
CHLORIDE: 91 mmol/L — AB (ref 101–111)
CO2: 24 mmol/L (ref 22–32)
CREATININE: 0.71 mg/dL (ref 0.61–1.24)
GFR calc non Af Amer: >60 mL/min (ref >60)
Glucose, Bld: 97 mg/dL (ref 65–99)
Potassium: 3.4 mmol/L — ABNORMAL LOW (ref 3.5–5.1)
SODIUM: 129 mmol/L — AB (ref 135–145)
Total Bilirubin: 0.5 mg/dL (ref 0.3–1.2)
Total Protein: 8.1 g/dL (ref 6.5–8.1)

## 2015-05-22 LAB — CBC WITH DIFFERENTIAL/PLATELET
Basophils Absolute: 0 10*3/uL (ref 0.0–0.1)
Basophils Relative: 0 %
EOS ABS: 0 10*3/uL (ref 0.0–0.7)
EOS PCT: 0 %
HCT: 42.9 % (ref 39.0–52.0)
Hemoglobin: 15.2 g/dL (ref 13.0–17.0)
LYMPHS ABS: 1.2 10*3/uL (ref 0.7–4.0)
LYMPHS PCT: 13 %
MCH: 33.6 pg (ref 26.0–34.0)
MCHC: 35.4 g/dL (ref 30.0–36.0)
MCV: 94.7 fL (ref 78.0–100.0)
MONO ABS: 0.8 10*3/uL (ref 0.1–1.0)
MONOS PCT: 8 %
Neutro Abs: 7.3 10*3/uL (ref 1.7–7.7)
Neutrophils Relative %: 79 %
PLATELETS: 228 10*3/uL (ref 150–400)
RBC: 4.53 MIL/uL (ref 4.22–5.81)
RDW: 13.3 % (ref 11.5–15.5)
WBC: 9.4 10*3/uL (ref 4.0–10.5)

## 2015-05-22 LAB — I-STAT CG4 LACTIC ACID, ED: Lactic Acid, Venous: 2.85 mmol/L (ref 0.5–2.0)

## 2015-05-22 LAB — LIPASE, BLOOD: Lipase: 167 U/L — ABNORMAL HIGH (ref 22–51)

## 2015-05-22 MED ORDER — OXYCODONE-ACETAMINOPHEN 5-325 MG PO TABS
2.0000 | ORAL_TABLET | Freq: Once | ORAL | Status: AC
  Administered 2015-05-22: 2 via ORAL
  Filled 2015-05-22: qty 2

## 2015-05-22 MED ORDER — SODIUM CHLORIDE 0.9 % IV BOLUS (SEPSIS)
1000.0000 mL | Freq: Once | INTRAVENOUS | Status: AC
Start: 2015-05-22 — End: 2015-05-22
  Administered 2015-05-22: 1000 mL via INTRAVENOUS

## 2015-05-22 MED ORDER — HYDROMORPHONE HCL 1 MG/ML IJ SOLN
1.0000 mg | Freq: Once | INTRAMUSCULAR | Status: AC
  Administered 2015-05-22: 1 mg via INTRAVENOUS
  Filled 2015-05-22: qty 1

## 2015-05-22 NOTE — Discharge Instructions (Signed)

## 2015-05-22 NOTE — ED Notes (Signed)
Bed: WA19 Expected date:  Expected time:  Means of arrival:  Comments: Ems- abdominal pain 

## 2015-05-22 NOTE — ED Notes (Signed)
Per EMS-drank last night, started having epigastric pain-vomited this am-history of pancreatitis

## 2015-05-22 NOTE — ED Provider Notes (Signed)
CSN: [id number]     Arrival date & time 05/22/15  1103 History   First MD Initiated Contact with Patient 05/22/15 1114     Chief Complaint  Patient presents with  . Abdominal Pain     (Consider location/radiation/quality/duration/timing/severity/associated sxs/prior Treatment) HPI Comments: The patient is a 49 year old male who is a chronic alcoholic, recently diagnosed with acute pancreatitis after being seen in the emergency department for abdominal pain and having lab work showing lipase elevation of over 1500, MRI showing pancreatic head inflammation but no other masses tumors or ductal obstructions. He improved significantly over his admission, the day after discharge he began drinking again and over the last month has had almost daily alcohol intake. He notes that yesterday his pain returned, epigastrium, associated with nausea and vomited this morning. There is no fevers chills coughing shortness of breath back pain or swelling. Symptoms are persistent, no medications for pain at home. Continue to try to drink this morning but vomited.  The history is provided by the patient.    Past Medical History  Diagnosis Date  . Hypertension    Past Surgical History  Procedure Laterality Date  . Hemorrhoid surgery    . Right knee surgery      Family History  Problem Relation Age of Onset  . Hypertension Mother   . Diabetes Mother   . Stroke Maternal Grandmother    Social History  Substance Use Topics  . Smoking status: Current Every Day Smoker -- 0.50 packs/day for 30 years    Types: Cigarettes  . Smokeless tobacco: None  . Alcohol Use: 3.6 oz/week    6 Cans of beer per week     Comment: everyday     Review of Systems  All other systems reviewed and are negative.     Allergies  Review of patient's allergies indicates no known allergies.  Home Medications   Prior to Admission medications   Medication Sig Start Date End Date Taking? Authorizing Provider  amLODipine  (NORVASC) 5 MG tablet Take 1 tablet (5 mg total) by mouth daily. 01/26/15  Yes Doris Cheadle, MD  aspirin 325 MG tablet Take 650 mg by mouth every 6 (six) hours as needed for headache.   Yes Historical Provider, MD  hydrochlorothiazide (HYDRODIURIL) 25 MG tablet Take 25 mg by mouth daily.  05/04/15  Yes Historical Provider, MD  lisinopril (PRINIVIL,ZESTRIL) 40 MG tablet Take 1 tablet (40 mg total) by mouth daily. 01/26/15  Yes Doris Cheadle, MD  pantoprazole (PROTONIX) 40 MG tablet Take 1 tablet (40 mg total) by mouth daily at 12 noon. 04/22/15  Yes Penny Pia, MD  buPROPion (WELLBUTRIN) 100 MG tablet Take 1 tablet (100 mg total) by mouth 2 (two) times daily. Patient not taking: Reported on 05/22/2015 01/26/15   Doris Cheadle, MD  simethicone (MYLICON) 125 MG chewable tablet Chew 125 mg by mouth every 6 (six) hours as needed for flatulence.    Historical Provider, MD   BP 132/76 mmHg  Pulse 87  Temp(Src) 98 F (36.7 C) (Oral)  Resp 17  SpO2 100% Physical Exam  Constitutional: He appears well-developed and well-nourished. No distress.  HENT:  Head: Normocephalic and atraumatic.  Mouth/Throat: Oropharynx is clear and moist. No oropharyngeal exudate.  Eyes: Conjunctivae and EOM are normal. Pupils are equal, round, and reactive to light. Right eye exhibits no discharge. Left eye exhibits no discharge. No scleral icterus.  Neck: Normal range of motion. Neck supple. No JVD present. No thyromegaly present.  Cardiovascular: Normal  rate, regular rhythm, normal heart sounds and intact distal pulses.  Exam reveals no gallop and no friction rub.   No murmur heard. Pulmonary/Chest: Effort normal and breath sounds normal. No respiratory distress. He has no wheezes. He has no rales.  Abdominal: Soft. Bowel sounds are normal. He exhibits no distension and no mass. There is tenderness ( Epigastric tenderness to palpation, mild, no guarding).  Musculoskeletal: Normal range of motion. He exhibits no edema or  tenderness.  Lymphadenopathy:    He has no cervical adenopathy.  Neurological: He is alert. Coordination normal.  Skin: Skin is warm and dry. No rash noted. No erythema.  Psychiatric: He has a normal mood and affect. His behavior is normal.  Nursing note and vitals reviewed.   ED Course  Procedures (including critical care time) Labs Review Labs Reviewed  COMPREHENSIVE METABOLIC PANEL - Abnormal; Notable for the following:    Sodium 129 (*)    Potassium 3.4 (*)    Chloride 91 (*)    BUN <5 (*)    All other components within normal limits  LIPASE, BLOOD - Abnormal; Notable for the following:    Lipase 167 (*)    All other components within normal limits  I-STAT CG4 LACTIC ACID, ED - Abnormal; Notable for the following:    Lactic Acid, Venous 2.85 (*)    All other components within normal limits  CBC WITH DIFFERENTIAL/PLATELET    Imaging Review No results found. I have personally reviewed and evaluated these images and lab results as part of my medical decision-making.    MDM   Final diagnoses:  Acute pancreatitis, unspecified pancreatitis type    Vital signs are unremarkable, he has recurrent clinical pancreatitis, labs pending, no indication for prescription repeat imaging by ultrasound or CT scan at this time, attempt pain control, refer for alcohol treatment as outpatient. The patient is in agreement with the plan. He does not have a surgical abdomen  The patient has improved with medications, reviewed all of his labs with him. He appears stable for discharge, counseled the patient on chronic alcohol abuse and need for abstinence, fluid only diet for 24 hours then gradual advancement, he agrees and has expressed his understanding.  Meds given in ED:  Medications  oxyCODONE-acetaminophen (PERCOCET/ROXICET) 5-325 MG per tablet 2 tablet (not administered)  sodium chloride 0.9 % bolus 1,000 mL (0 mLs Intravenous Stopped 05/22/15 1350)  HYDROmorphone (DILAUDID) injection  1 mg (1 mg Intravenous Given 05/22/15 1136)    New Prescriptions   No medications on file      Eber Hong, MD 05/22/15 1350

## 2015-05-22 NOTE — ED Notes (Signed)
Pt c/o epigastric pain x1 day, hx of pancreatitis r/t ETOH. Last drink yesterday. One episode of emesis, one episode of diarrhea.

## 2015-05-25 ENCOUNTER — Ambulatory Visit: Payer: Self-pay | Admitting: Internal Medicine

## 2015-06-10 ENCOUNTER — Encounter (HOSPITAL_BASED_OUTPATIENT_CLINIC_OR_DEPARTMENT_OTHER): Payer: Self-pay | Admitting: Clinical

## 2015-06-10 ENCOUNTER — Ambulatory Visit: Payer: MEDICAID | Attending: Internal Medicine | Admitting: Internal Medicine

## 2015-06-10 ENCOUNTER — Encounter: Payer: Self-pay | Admitting: Internal Medicine

## 2015-06-10 VITALS — BP 134/93 | HR 101 | Temp 98.0°F | Resp 16 | Ht 65.0 in | Wt 154.0 lb

## 2015-06-10 DIAGNOSIS — Z79899 Other long term (current) drug therapy: Secondary | ICD-10-CM | POA: Insufficient documentation

## 2015-06-10 DIAGNOSIS — Z720 Tobacco use: Secondary | ICD-10-CM

## 2015-06-10 DIAGNOSIS — F1010 Alcohol abuse, uncomplicated: Secondary | ICD-10-CM

## 2015-06-10 DIAGNOSIS — Z8249 Family history of ischemic heart disease and other diseases of the circulatory system: Secondary | ICD-10-CM | POA: Insufficient documentation

## 2015-06-10 DIAGNOSIS — K852 Alcohol induced acute pancreatitis without necrosis or infection: Secondary | ICD-10-CM | POA: Insufficient documentation

## 2015-06-10 DIAGNOSIS — I10 Essential (primary) hypertension: Secondary | ICD-10-CM | POA: Insufficient documentation

## 2015-06-10 DIAGNOSIS — Z7982 Long term (current) use of aspirin: Secondary | ICD-10-CM | POA: Insufficient documentation

## 2015-06-10 DIAGNOSIS — Z833 Family history of diabetes mellitus: Secondary | ICD-10-CM | POA: Insufficient documentation

## 2015-06-10 DIAGNOSIS — F17210 Nicotine dependence, cigarettes, uncomplicated: Secondary | ICD-10-CM | POA: Insufficient documentation

## 2015-06-10 DIAGNOSIS — F1020 Alcohol dependence, uncomplicated: Secondary | ICD-10-CM | POA: Insufficient documentation

## 2015-06-10 MED ORDER — PANTOPRAZOLE SODIUM 40 MG PO TBEC: 40.0000 mg | DELAYED_RELEASE_TABLET | Freq: Every day | ORAL | Status: DC

## 2015-06-10 MED ORDER — TRAMADOL HCL 50 MG PO TABS: 50.0000 mg | ORAL_TABLET | Freq: Three times a day (TID) | ORAL | Status: DC | PRN

## 2015-06-10 MED ORDER — AMLODIPINE BESYLATE 5 MG PO TABS: 5.0000 mg | ORAL_TABLET | Freq: Every day | ORAL | Status: DC

## 2015-06-10 MED ORDER — HYDROCHLOROTHIAZIDE 25 MG PO TABS: 25.0000 mg | ORAL_TABLET | Freq: Every day | ORAL | Status: DC

## 2015-06-10 MED ORDER — LISINOPRIL 40 MG PO TABS: 40.0000 mg | ORAL_TABLET | Freq: Every day | ORAL | Status: DC

## 2015-06-10 MED ORDER — KETOROLAC TROMETHAMINE 30 MG/ML IM SOLN: 30.0000 mg | Freq: Once | INTRAMUSCULAR | Status: DC

## 2015-06-10 MED ORDER — KETOROLAC TROMETHAMINE 30 MG/ML IJ SOLN
30.0000 mg | Freq: Once | INTRAMUSCULAR | Status: AC
  Administered 2015-06-10: 30 mg via INTRAMUSCULAR

## 2015-06-10 NOTE — Patient Instructions (Signed)
Chemical Dependency Chemical dependency is an addiction to drugs or alcohol. It is characterized by the repeated behavior of seeking out and using drugs and alcohol despite harmful consequences to the health and safety of ones self and others.  RISK FACTORS There are certain situations or behaviors that increase a person's risk for chemical dependency. These include:  A family history of chemical dependency.  A history of mental health issues, including depression and anxiety.  A home environment where drugs and alcohol are easily available to you.  Drug or alcohol use at a young age. SYMPTOMS  The following symptoms can indicate chemical dependency:  Inability to limit the use of drugs or alcohol.  Nausea, sweating, shakiness, and anxiety that occurs when alcohol or drugs are not being used.  An increase in amount of drugs or alcohol that is necessary to get drunk or high. People who experience these symptoms can assess their use of drugs and alcohol by asking themselves the following questions:  Have you been told by friends or family that they are worried about your use of alcohol or drugs?  Do friends and family ever tell you about things you did while drinking alcohol or using drugs that you do not remember?  Do you lie about using alcohol or drugs or about the amounts you use?  Do you have difficulty completing daily tasks unless you use alcohol or drugs?  Is the level of your work or school performance lower because of your drug or alcohol use?  Do you get sick from using drugs or alcohol but keep using anyway?  Do you feel uncomfortable in social situations unless you use alcohol or drugs?  Do you use drugs or alcohol to help forget problems? An answer of yes to any of these questions may indicate chemical dependency. Professional evaluation is suggested.   This information is not intended to replace advice given to you by your health care provider. Make sure you  discuss any questions you have with your health care provider.   Document Released: 08/02/2001 Document Revised: 10/31/2011 Document Reviewed: 10/14/2010 Elsevier Interactive Patient Education 2016 Else[employer] Acute Pancreatitis Acute pancreatitis is a disease in which the pancreas becomes suddenly inflamed. The pancreas is a large gland located behind your stomach. The pancreas produces enzymes that help digest food. The pancreas also releases the hormones glucagon and insulin that help regulate blood sugar. Damage to the pancreas occurs when the digestive enzymes from the pancreas are activated and begin attacking the pancreas before being released into the intestine. Most acute attacks last a couple of days and can cause serious complications. Some people become dehydrated and develop low blood pressure. In severe cases, bleeding into the pancreas can lead to shock and can be life-threatening. The lungs, heart, and kidneys may fail. CAUSES  Pancreatitis can happen to anyone. In some cases, the cause is unknown. Most cases are caused by:  Alcohol abuse.  Gallstones. Other less common causes are:  Certain medicines.  Exposure to certain chemicals.  Infection.  Damage caused by an accident (trauma).  Abdominal surgery. SYMPTOMS   Pain in the upper abdomen that may radiate to the back.  Tenderness and swelling of the abdomen.  Nausea and vomiting. DIAGNOSIS  Your caregiver will perform a physical exam. Blood and stool tests may be done to confirm the diagnosis. Imaging tests may also be done, such as X-rays, CT scans, or an ultrasound of the abdomen. TREATMENT  Treatment usually requires a stay in the  hospital. Treatment may include:  Pain medicine.  Fluid replacement through an intravenous line (IV).  Placing a tube in the stomach to remove stomach contents and control vomiting.  Not eating for 3 or 4 days. This gives your pancreas a rest, because enzymes are not being  produced that can cause further damage.  Antibiotic medicines if your condition is caused by an infection.  Surgery of the pancreas or gallbladder. HOME CARE INSTRUCTIONS   Follow the diet advised by your caregiver. This may involve avoiding alcohol and decreasing the amount of fat in your diet.  Eat smaller, more frequent meals. This reduces the amount of digestive juices the pancreas produces.  Drink enough fluids to keep your urine clear or pale yellow.  Only take over-the-counter or prescription medicines as directed by your caregiver.  Avoid drinking alcohol if it caused your condition.  Do not smoke.  Get plenty of rest.  Check your blood sugar at home as directed by your caregiver.  Keep all follow-up appointments as directed by your caregiver. SEEK MEDICAL CARE IF:   You do not recover as quickly as expected.  You develop new or worsening symptoms.  You have persistent pain, weakness, or nausea.  You recover and then have another episode of pain. SEEK IMMEDIATE MEDICAL CARE IF:   You are unable to eat or keep fluids down.  Your pain becomes severe.  You have a fever or persistent symptoms for more than 2 to 3 days.  You have a fever and your symptoms suddenly get worse.  Your skin or the white part of your eyes turn yellow (jaundice).  You develop vomiting.  You feel dizzy, or you faint.  Your blood sugar is high (over 300 mg/dL). MAKE SURE YOU:   Understand these instructions.  Will watch your condition.  Will get help right away if you are not doing well or get worse.   This information is not intended to replace advice given to you by your health care provider. Make sure you discuss any questions you have with your health care provider.   Document Released: 08/08/2005 Document Revised: 02/07/2012 Document Reviewed: 11/17/2011 Elsevier Interactive Patient Education [employer]2016 Elsevier Inc.

## 2015-06-10 NOTE — Progress Notes (Signed)
Patient ID: Jimmy Zimmerman, male   DOB: 05-15-1966, 49 y.o.   MRN: [id number]  CC: ER f/u  HPI: Jimmy Zimmerman is a 49 y.o. male here today for a hospital follow up.  Patient has past medical history of alcoholism, pancreatitis, HTN, and tobacco use. Patient reports that he was recently seen in the ER twice in the past 2 months. Two weeks ago he states that he began to drink alcohol after his hospital discharge for acute pancreatitis. Once he began drinking again he noticed the pain again. Today he reports that he drinks a couple of beers per day and now he is having that same abdominal pain that feels like a repeat pancreatitis flare. Has not ate since last night. He rates pain as a 10/10. Review of charts reveal that patient was positive for cocaine in July. Patient reports that he is not sure if he is ready for counseling regarding his alcohol and drug abuse.    No Known Allergies Past Medical History  Diagnosis Date  . Hypertension    Current Outpatient Prescriptions on File Prior to Visit  Medication Sig Dispense Refill  . amLODipine (NORVASC) 5 MG tablet Take 1 tablet (5 mg total) by mouth daily. 30 tablet 3  . hydrochlorothiazide (HYDRODIURIL) 25 MG tablet Take 25 mg by mouth daily.   3  . lisinopril (PRINIVIL,ZESTRIL) 40 MG tablet Take 1 tablet (40 mg total) by mouth daily. 30 tablet 3  . aspirin 325 MG tablet Take 650 mg by mouth every 6 (six) hours as needed for headache.    Marland Kitchen buPROPion (WELLBUTRIN) 100 MG tablet Take 1 tablet (100 mg total) by mouth 2 (two) times daily. (Patient not taking: Reported on 05/22/2015) 60 tablet 3  . pantoprazole (PROTONIX) 40 MG tablet Take 1 tablet (40 mg total) by mouth daily at 12 noon. 30 tablet 0  . simethicone (MYLICON) 125 MG chewable tablet Chew 125 mg by mouth every 6 (six) hours as needed for flatulence.     No current facility-administered medications on file prior to visit.   Family History  Problem Relation Age of Onset  . Hypertension Mother    . Diabetes Mother   . Stroke Maternal Grandmother    Social History   Social History  . Marital Status: Single    Spouse Name: N/A  . Number of Children: N/A  . Years of Education: N/A   Occupational History  . Not on file.   Social History Main Topics  . Smoking status: Current Every Day Smoker -- 0.50 packs/day for 30 years    Types: Cigarettes  . Smokeless tobacco: Not on file  . Alcohol Use: 3.6 oz/week    6 Cans of beer per week     Comment: everyday   . Drug Use: No  . Sexual Activity: Not on file   Other Topics Concern  . Not on file   Social History Narrative    Review of Systems  Constitutional: Negative for diaphoresis.  Gastrointestinal: Positive for nausea and abdominal pain. Negative for vomiting and blood in stool.  Genitourinary: Negative for dysuria.  Neurological: Positive for headaches. Negative for dizziness and tremors.  All other systems reviewed and are negative.  Objective:   Filed Vitals:   06/10/15 1402  BP: 134/93  Pulse: 101  Temp: 98 F (36.7 C)  Resp: 16    Physical Exam  Constitutional: He is oriented to person, place, and time.  Cardiovascular: Normal rate, regular rhythm and normal heart  sounds.   Pulmonary/Chest: Effort normal and breath sounds normal. He has no wheezes.  Abdominal: Soft. Bowel sounds are normal. He exhibits no distension. There is tenderness (generalized). There is no rebound and no guarding.  Neurological: He is alert and oriented to person, place, and time.  Skin: Skin is warm and dry.  Psychiatric: He has a normal mood and affect.     Lab Results  Component Value Date   WBC 9.4 05/22/2015   HGB 15.2 05/22/2015   HCT 42.9 05/22/2015   MCV 94.7 05/22/2015   PLT 228 05/22/2015   Lab Results  Component Value Date   CREATININE 0.71 05/22/2015   BUN <5* 05/22/2015   NA 129* 05/22/2015   K 3.4* 05/22/2015   CL 91* 05/22/2015   CO2 24 05/22/2015    Lab Results  Component Value Date   HGBA1C  5.5 09/22/2014   Lipid Panel     Component Value Date/Time   CHOL 195 04/18/2015 0853   TRIG 36 04/18/2015 0853   HDL 98 04/18/2015 0853   CHOLHDL 2.0 04/18/2015 0853   VLDL 7 04/18/2015 0853   LDLCALC 90 04/18/2015 0853       Assessment and plan:   [name] was seen today for follow-up.  Diagnoses and all orders for this visit:  Alcohol-induced acute pancreatitis, unspecified complication status -     ketorolac (TORADOL) 30 MG/ML injection 30 mg; Inject 1 mL (30 mg total) into the muscle once in office -     pantoprazole (PROTONIX) 40 MG tablet; Take 1 tablet (40 mg total) by mouth daily at 12 noon. -     traMADol (ULTRAM) 50 MG tablet; Take 1 tablet (50 mg total) by mouth every 8 (eight) hours as needed. I do not feel patient requires hospitalization at this time. I have addressed pain control and prompt cessation of alcohol. Explained that if he continues to drink he will continue to have flares and pain. Went over long term complications of pancreatitis. He is safe for discharge home. Explained signs and symptoms that should warrant immediate attention.  Patient verbalized understanding with teach back used.  Alcohol abuse Patient referred to LCSW for alcohol abuse counseling and referrals.  Essential hypertension -     amLODipine (NORVASC) 5 MG tablet; Take 1 tablet (5 mg total) by mouth daily. -     hydrochlorothiazide (HYDRODIURIL) 25 MG tablet; Take 1 tablet (25 mg total) by mouth daily. -     lisinopril (PRINIVIL,ZESTRIL) 40 MG tablet; Take 1 tablet (40 mg total) by mouth daily. Patient blood pressure is stable and may continue on current medication.  Education on diet, exercise, and modifiable risk factors discussed. Will obtain appropriate labs as needed. Will follow up in 3-6 months.   Tobacco use Smoking cessation discussed for 3 minutes, patient is not willing to quit at this time. Will continue to assess on each visit. Discussed increased risk for diseases such as  cancer, heart disease, and stroke.    Return in about 6 months (around 12/09/2015) for Hypertension.       Ambrose FinlandValerie A Bob Eastwood, NP-C [facility] [phone] 06/10/2015, 2:17 PM

## 2015-06-10 NOTE — Progress Notes (Signed)
ASSESSMENT: Pt currently experiencing alcohol abuse. Pt needs to f/u with PCP; would benefit from supportive counseling regarding motivation for change.  Stage of Change:precontemplative  PLAN: 1. F/U with behavioral health consultant at next PCP appointment 2. Psychiatric Medications: Wellbutrin. 3. Behavioral recommendation(s):   -Consider alcohol treatment (inpatient or outpatient)  SUBJECTIVE: Pt. referred by [facility] CommonsValerie Keck for alcohol treatment options:  Pt. reports the following symptoms/concerns: Pt wants to know how long one would stay at inpatient treatment [facility]), and says he will "think about it"; primary concern is being able to have pain meds, as he is feeling pain in his abdomen.  Duration of problem: today Severity: mild  OBJECTIVE: Orientation & Cognition: Oriented x3. Thought processes normal and appropriate to situation. Mood: appropriate. Affect: appropriate Appearance: appropriate Risk of harm to self or others: no risk of harm to self or others Substance use: alcohol, tobacco, cocaine Assessments administered: PHQ9: 5/ GAD7: 5  Diagnosis: Alcohol abuse CPT Code: F10.10 -------------------------------------------- Other(s) present in the room: none  Time spent with patient in exam room: 10 minutes

## 2015-06-10 NOTE — Progress Notes (Signed)
Patient here for follow up from the hospital Was in there for pancreatitis Since discharge he has drank a few beers and now having pain in the same area

## 2015-08-31 MED FILL — LISINOPRIL 40 MG TABLET: 40 | 30 days supply | Qty: 30 | Fill #2

## 2015-08-31 MED FILL — ?AMLODIPINE BESYLATE 5 MG T: 5 | 30 days supply | Qty: 30 | Fill #2

## 2015-08-31 MED FILL — HYDROCHLOROTHIAZIDE 25 MG T: 25 | 30 days supply | Qty: 30 | Fill #1

## 2015-10-13 MED FILL — LISINOPRIL 40 MG TABLET: 40 | 30 days supply | Qty: 30 | Fill #3

## 2015-10-13 MED FILL — ?AMLODIPINE BESYLATE 5 MG T: 5 | 30 days supply | Qty: 30 | Fill #3

## 2015-10-13 MED FILL — HYDROCHLOROTHIAZIDE 25 MG T: 25 | 30 days supply | Qty: 30 | Fill #2

## 2017-05-18 ENCOUNTER — Ambulatory Visit: Payer: Self-pay | Attending: Family Medicine | Admitting: Family Medicine

## 2017-05-18 ENCOUNTER — Encounter: Payer: Self-pay | Admitting: Family Medicine

## 2017-05-18 VITALS — BP 158/92 | HR 89 | Temp 98.9°F | Resp 18 | Ht 67.0 in | Wt 174.4 lb

## 2017-05-18 DIAGNOSIS — J069 Acute upper respiratory infection, unspecified: Secondary | ICD-10-CM | POA: Insufficient documentation

## 2017-05-18 DIAGNOSIS — Z79899 Other long term (current) drug therapy: Secondary | ICD-10-CM | POA: Insufficient documentation

## 2017-05-18 DIAGNOSIS — Z23 Encounter for immunization: Secondary | ICD-10-CM | POA: Insufficient documentation

## 2017-05-18 DIAGNOSIS — Z7982 Long term (current) use of aspirin: Secondary | ICD-10-CM | POA: Insufficient documentation

## 2017-05-18 DIAGNOSIS — E785 Hyperlipidemia, unspecified: Secondary | ICD-10-CM | POA: Insufficient documentation

## 2017-05-18 DIAGNOSIS — I10 Essential (primary) hypertension: Secondary | ICD-10-CM | POA: Insufficient documentation

## 2017-05-18 DIAGNOSIS — F17200 Nicotine dependence, unspecified, uncomplicated: Secondary | ICD-10-CM | POA: Insufficient documentation

## 2017-05-18 DIAGNOSIS — Z833 Family history of diabetes mellitus: Secondary | ICD-10-CM | POA: Insufficient documentation

## 2017-05-18 DIAGNOSIS — B9789 Other viral agents as the cause of diseases classified elsewhere: Secondary | ICD-10-CM

## 2017-05-18 LAB — POCT GLYCOSYLATED HEMOGLOBIN (HGB A1C): Hemoglobin A1C: 5.4

## 2017-05-18 MED ORDER — GUAIFENESIN-DM 100-10 MG/5ML PO SYRP: 5.0000 mL | ORAL_SOLUTION | ORAL | 0 refills | Status: DC | PRN

## 2017-05-18 MED ORDER — LISINOPRIL 40 MG PO TABS: 40.0000 mg | ORAL_TABLET | Freq: Every day | ORAL | 2 refills | Status: DC

## 2017-05-18 MED ORDER — HYDROCHLOROTHIAZIDE 25 MG PO TABS: 25.0000 mg | ORAL_TABLET | Freq: Every day | ORAL | 2 refills | Status: DC

## 2017-05-18 MED ORDER — GUAIFENESIN ER 600 MG PO TB12: 600.0000 mg | ORAL_TABLET | Freq: Two times a day (BID) | ORAL | Status: DC

## 2017-05-18 MED ORDER — AMLODIPINE BESYLATE 10 MG PO TABS: 10.0000 mg | ORAL_TABLET | Freq: Every day | ORAL | 2 refills | Status: DC

## 2017-05-18 MED FILL — HYDROCHLOROTHIAZIDE 25 MG T: 25 | 30 days supply | Qty: 30 | Fill #0

## 2017-05-18 MED FILL — AMLODIPINE BESYLATE 10 MG T: 10 | 30 days supply | Qty: 30 | Fill #0

## 2017-05-18 MED FILL — LISINOPRIL 40 MG TABLET: 40 | 30 days supply | Qty: 30 | Fill #0

## 2017-05-18 NOTE — Progress Notes (Signed)
Subjective:  Patient ID: Jimmy Zimmerman, male    DOB: Nov 14, 1965  Age: 51 y.o. MRN: [id number]  CC: Hypertension   HPI Jimmy Zimmerman presents for hypertension. He is not exercising and is not adherent to low salt diet.  He does not check BP at home. Cardiac symptoms none. Patient denies chest pain, chest pressure/discomfort, claudication, dyspnea, lower extremity edema, near-syncope, palpitations and syncope.  Cardiovascular risk factors: dyslipidemia, hypertension, male gender, smoking/tobacco exposure, and sedentary lifestyle. He is not ready to quit smoking. Use of agents associated with hypertension: none. History of target. organ damage: none. He complaints of cough and rhinorrhea that began a few day ago. He does report recent sick contacts. Denies taking anything for symptoms.       Outpatient Medications Prior to Visit  Medication Sig Dispense Refill  . amLODipine (NORVASC) 5 MG tablet Take 1 tablet (5 mg total) by mouth daily. 30 tablet 5  . aspirin 325 MG tablet Take 650 mg by mouth every 6 (six) hours as needed for headache.    Marland Kitchen buPROPion (WELLBUTRIN) 100 MG tablet Take 1 tablet (100 mg total) by mouth 2 (two) times daily. (Patient not taking: Reported on 05/22/2015) 60 tablet 3  . hydrochlorothiazide (HYDRODIURIL) 25 MG tablet Take 1 tablet (25 mg total) by mouth daily. 30 tablet 5  . lisinopril (PRINIVIL,ZESTRIL) 40 MG tablet Take 1 tablet (40 mg total) by mouth daily. 30 tablet 5  . pantoprazole (PROTONIX) 40 MG tablet Take 1 tablet (40 mg total) by mouth daily at 12 noon. 30 tablet 5  . simethicone (MYLICON) 125 MG chewable tablet Chew 125 mg by mouth every 6 (six) hours as needed for flatulence.    . traMADol (ULTRAM) 50 MG tablet Take 1 tablet (50 mg total) by mouth every 8 (eight) hours as needed. 60 tablet 0   No facility-administered medications prior to visit.     ROS Review of Systems  Constitutional: Negative.   Eyes: Negative.   Respiratory: Negative.     Cardiovascular: Negative.   Gastrointestinal: Negative.   Skin: Negative.   Neurological: Negative.        Objective:  BP (!) 158/92 (BP Location: Left Arm, Patient Position: Sitting, Cuff Size: Normal)   Pulse 89   Temp 98.9 F (37.2 C) (Oral)   Resp 18   Ht  (1.702 m)   Wt 174 lb 6.4 oz (79.1 kg)   SpO2 96%   BMI 27.31 kg/m   BP/Weight 05/18/2017 06/10/2015 05/22/2015  Systolic BP 158 134 132  Diastolic BP 92 93 76  Wt. (Lbs) 174.4 154 -  BMI 27.31 25.63 -     Physical Exam  Constitutional: He appears well-developed and well-nourished.  HENT:  Head: Normocephalic and atraumatic.  Right Ear: External ear normal.  Left Ear: External ear normal.  Nose: Rhinorrhea present.  Mouth/Throat: No oropharyngeal exudate.  Eyes: Pupils are equal, round, and reactive to light. Conjunctivae are normal.  Neck: No JVD present.  Cardiovascular: Normal rate, regular rhythm, normal heart sounds and intact distal pulses.   Pulmonary/Chest: Effort normal. No respiratory distress. He has rhonchi.  Abdominal: Soft. Bowel sounds are normal. There is no tenderness.  Skin: Skin is warm and dry.  Nursing note and vitals reviewed.   Assessment & Plan:   1. Viral URI with cough  - guaiFENesin-dextromethorphan (ROBITUSSIN DM) 100-10 MG/5ML syrup; Take 5 mLs by mouth every 4 (four) hours as needed for cough.  Dispense: 236 mL; Refill: 0 -  guaiFENesin (MUCINEX) 600 MG 12 hr tablet; Take 1 tablet (600 mg total) by mouth 2 (two) times daily. For 7 days.  2. Essential hypertension Schedule BP recheck in 2 weeks with nurse. If BP is greater than 90/60 (MAP 65 or greater) but not less than 130/80 may add dose of coreg 3.125 QD and recheck in another 2 weeks.  - CMP and Liver - Lipid Panel - amLODipine (NORVASC) 10 MG tablet; Take 1 tablet (10 mg total) by mouth daily.  Dispense: 30 tablet; Refill: 2 - lisinopril (PRINIVIL,ZESTRIL) 40 MG tablet; Take 1 tablet (40 mg total) by mouth daily.   Dispense: 30 tablet; Refill: 2 - hydrochlorothiazide (HYDRODIURIL) 25 MG tablet; Take 1 tablet (25 mg total) by mouth daily.  Dispense: 30 tablet; Refill: 2  3. Family history of diabetes mellitus in mother  - POCT glycosylated hemoglobin (Hb A1C)  4. Current smoker  Smoking cessation discussed, patient not ready to quit at this time.  5. Needs flu shot  - Flu Vaccine QUAD 6+ mos PF IM (Fluarix Quad PF)   Meds ordered this encounter  Medications  . guaiFENesin-dextromethorphan (ROBITUSSIN DM) 100-10 MG/5ML syrup    Sig: Take 5 mLs by mouth every 4 (four) hours as needed for cough.    Dispense:  236 mL    Refill:  0    Order Specific Question:   Supervising Provider    Answer:   Quentin Angst [id number]  . amLODipine (NORVASC) 10 MG tablet    Sig: Take 1 tablet (10 mg total) by mouth daily.    Dispense:  30 tablet    Refill:  2    Order Specific Question:   Supervising Provider    Answer:   Quentin Angst [id number]  . lisinopril (PRINIVIL,ZESTRIL) 40 MG tablet    Sig: Take 1 tablet (40 mg total) by mouth daily.    Dispense:  30 tablet    Refill:  2    Order Specific Question:   Supervising Provider    Answer:   Quentin Angst [id number]  . hydrochlorothiazide (HYDRODIURIL) 25 MG tablet    Sig: Take 1 tablet (25 mg total) by mouth daily.    Dispense:  30 tablet    Refill:  2    Order Specific Question:   Supervising Provider    Answer:   Quentin Angst [id number]  . guaiFENesin (MUCINEX) 600 MG 12 hr tablet    Sig: Take 1 tablet (600 mg total) by mouth 2 (two) times daily. For 7 days.    Order Specific Question:   Supervising Provider    Answer:   Quentin Angst [[id number]]    Follow-up: Return in about 2 weeks (around 06/01/2017), or if symptoms worsen or fail to improve, for BP check with [facility].   Lizbeth Bark FNP

## 2017-05-18 NOTE — Progress Notes (Signed)
Patient is here for f/up  

## 2017-05-18 NOTE — Patient Instructions (Addendum)
Upper Respiratory Infection, Adult Most upper respiratory infections (URIs) are caused by a virus. A URI affects the nose, throat, and upper air passages. The most common type of URI is often called "the common cold." Follow these instructions at home:  Take medicines only as told by your doctor.  Gargle warm saltwater or take cough drops to comfort your throat as told by your doctor.  Use a warm mist humidifier or inhale steam from a shower to increase air moisture. This may make it easier to breathe.  Drink enough fluid to keep your pee (urine) clear or pale yellow.  Eat soups and other clear broths.  Have a healthy diet.  Rest as needed.  Go back to work when your fever is gone or your doctor says it is okay. ? You may need to stay home longer to avoid giving your URI to others. ? You can also wear a face mask and wash your hands often to prevent spread of the virus.  Use your inhaler more if you have asthma.  Do not use any tobacco products, including cigarettes, chewing tobacco, or electronic cigarettes. If you need help quitting, ask your doctor. Contact a doctor if:  You are getting worse, not better.  Your symptoms are not helped by medicine.  You have chills.  You are getting more short of breath.  You have brown or red mucus.  You have yellow or brown discharge from your nose.  You have pain in your face, especially when you bend forward.  You have a fever.  You have puffy (swollen) neck glands.  You have pain while swallowing.  You have white areas in the back of your throat. Get help right away if:  You have very bad or constant: ? Headache. ? Ear pain. ? Pain in your forehead, behind your eyes, and over your cheekbones (sinus pain). ? Chest pain.  You have long-lasting (chronic) lung disease and any of the following: ? Wheezing. ? Long-lasting cough. ? Coughing up blood. ? A change in your usual mucus.  You have a stiff neck.  You have  changes in your: ? Vision. ? Hearing. ? Thinking. ? Mood. This information is not intended to replace advice given to you by your health care provider. Make sure you discuss any questions you have with your health care provider. Document Released: 01/25/2008 Document Revised: 04/10/2016 Document Reviewed: 11/13/2013 Elsevier Interactive Patient Education  2018 [employer].    Hypertension Hypertension is another name for high blood pressure. High blood pressure forces your heart to work harder to pump blood. This can cause problems over time. There are two numbers in a blood pressure reading. There is a top number (systolic) over a bottom number (diastolic). It is best to have a blood pressure below 120/80. Healthy choices can help lower your blood pressure. You may need medicine to help lower your blood pressure if:  Your blood pressure cannot be lowered with healthy choices.  Your blood pressure is higher than 130/80.  Follow these instructions at home: Eating and drinking  If directed, follow the DASH eating plan. This diet includes: ? Filling half of your plate at each meal with fruits and vegetables. ? Filling one quarter of your plate at each meal with whole grains. Whole grains include whole wheat pasta, brown rice, and whole grain bread. ? Eating or drinking low-fat dairy products, such as skim milk or low-fat yogurt. ? Filling one quarter of your plate at each meal with low-fat (  lean) proteins. Low-fat proteins include fish, skinless chicken, eggs, beans, and tofu. ? Avoiding fatty meat, cured and processed meat, or chicken with skin. ? Avoiding premade or processed food.  Eat less than 1,500 mg of salt (sodium) a day.  Limit alcohol use to no more than 1 drink a day for nonpregnant women and 2 drinks a day for men. One drink equals 12 oz of beer, 5 oz of wine, or 1 oz of hard liquor. Lifestyle  Work with your doctor to stay at a healthy weight or to lose weight. Ask  your doctor what the best weight is for you.  Get at least 30 minutes of exercise that causes your heart to beat faster (aerobic exercise) most days of the week. This may include walking, swimming, or biking.  Get at least 30 minutes of exercise that strengthens your muscles (resistance exercise) at least 3 days a week. This may include lifting weights or pilates.  Do not use any products that contain nicotine or tobacco. This includes cigarettes and e-cigarettes. If you need help quitting, ask your doctor.  Check your blood pressure at home as told by your doctor.  Keep all follow-up visits as told by your doctor. This is important. Medicines  Take over-the-counter and prescription medicines only as told by your doctor. Follow directions carefully.  Do not skip doses of blood pressure medicine. The medicine does not work as well if you skip doses. Skipping doses also puts you at risk for problems.  Ask your doctor about side effects or reactions to medicines that you should watch for. Contact a doctor if:  You think you are having a reaction to the medicine you are taking.  You have headaches that keep coming back (recurring).  You feel dizzy.  You have swelling in your ankles.  You have trouble with your vision. Get help right away if:  You get a very bad headache.  You start to feel confused.  You feel weak or numb.  You feel faint.  You get very bad pain in your: ? Chest. ? Belly (abdomen).  You throw up (vomit) more than once.  You have trouble breathing. Summary  Hypertension is another name for high blood pressure.  Making healthy choices can help lower blood pressure. If your blood pressure cannot be controlled with healthy choices, you may need to take medicine. This information is not intended to replace advice given to you by your health care provider. Make sure you discuss any questions you have with your health care provider. Document Released:  01/25/2008 Document Revised: 07/06/2016 Document Reviewed: 07/06/2016 Elsevier Interactive Patient Education  [employer].

## 2017-05-19 LAB — CMP AND LIVER
ALBUMIN: 4.7 g/dL (ref 3.5–5.5)
ALT: 35 IU/L (ref 0–44)
AST: 52 IU/L — AB (ref 0–40)
Alkaline Phosphatase: 71 IU/L (ref 39–117)
BUN: 9 mg/dL (ref 6–24)
Bilirubin Total: 0.9 mg/dL (ref 0.0–1.2)
Bilirubin, Direct: 0.25 mg/dL (ref 0.00–0.40)
CALCIUM: 10.3 mg/dL — AB (ref 8.7–10.2)
CO2: 24 mmol/L (ref 20–29)
CREATININE: 1.11 mg/dL (ref 0.76–1.27)
Chloride: 100 mmol/L (ref 96–106)
GFR calc Af Amer: 88 mL/min/{1.73_m2} (ref >59)
GFR, EST NON AFRICAN AMERICAN: 76 mL/min/{1.73_m2} (ref >59)
GLUCOSE: 94 mg/dL (ref 65–99)
POTASSIUM: 4 mmol/L (ref 3.5–5.2)
Sodium: 143 mmol/L (ref 134–144)
Total Protein: 7.1 g/dL (ref 6.0–8.5)

## 2017-05-19 LAB — LIPID PANEL
CHOL/HDL RATIO: 1.5 ratio (ref 0.0–5.0)
Cholesterol, Total: 194 mg/dL (ref 100–199)
HDL: 126 mg/dL (ref >39)
LDL CALC: 49 mg/dL (ref 0–99)
TRIGLYCERIDES: 94 mg/dL (ref 0–149)
VLDL Cholesterol Cal: 19 mg/dL (ref 5–40)

## 2017-05-22 ENCOUNTER — Other Ambulatory Visit: Payer: Self-pay | Admitting: Family Medicine

## 2017-05-23 ENCOUNTER — Telehealth: Payer: Self-pay

## 2017-05-23 NOTE — Telephone Encounter (Signed)
CMA call regarding lab results   Patient verify DOB  Patient was aware and understood  

## 2017-05-23 NOTE — Telephone Encounter (Signed)
-----   Message from Lizbeth Bark, [place] sent at 05/22/2017  6:06 PM EDT ----- Labs normal. Kidney function normal Liver function normal Cholesterol levels normal.

## 2017-06-23 MED FILL — HYDROCHLOROTHIAZIDE 25 MG T: 25 | 30 days supply | Qty: 30 | Fill #1

## 2017-06-23 MED FILL — AMLODIPINE BESYLATE 10 MG T: 10 | 30 days supply | Qty: 30 | Fill #1

## 2017-06-23 MED FILL — LISINOPRIL 40 MG TABLET: 40 | 30 days supply | Qty: 30 | Fill #1

## 2017-06-29 IMAGING — CT CT CERVICAL SPINE W/O CM
4 of 6 series · 13 of 33 positions shown, 15 images · non-contrast
Comparison: None.

CLINICAL DATA: 48-year-old male with fall and laceration in the
back of head

EXAM:
CT HEAD WITHOUT CONTRAST
CT CERVICAL SPINE WITHOUT CONTRAST
TECHNIQUE: Multidetector CT imaging of the head and cervical spine was
performed following the standard protocol without intravenous
contrast. Multiplanar CT image reconstructions of the cervical spine
were also generated.

[Series 302: soft tissue, idose (2) · axial · 0.29mm/px · z∈[-177,-89]mm · 3 of 88 slices shown]
[im 22/88  soft-tissue]
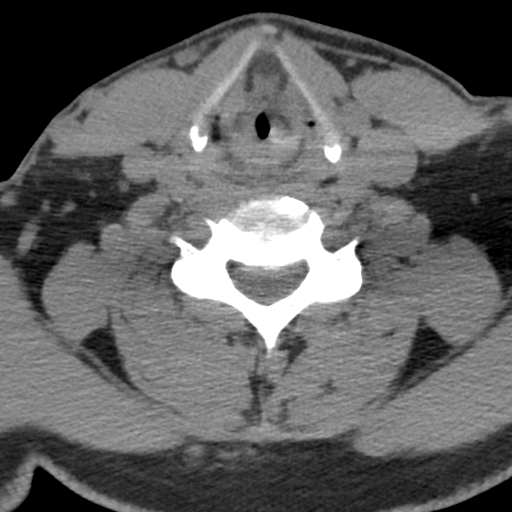
[im 44/88  soft-tissue]
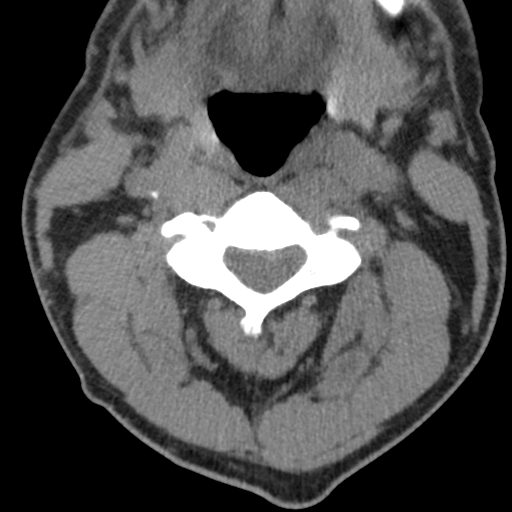
[im 66/88  soft-tissue]
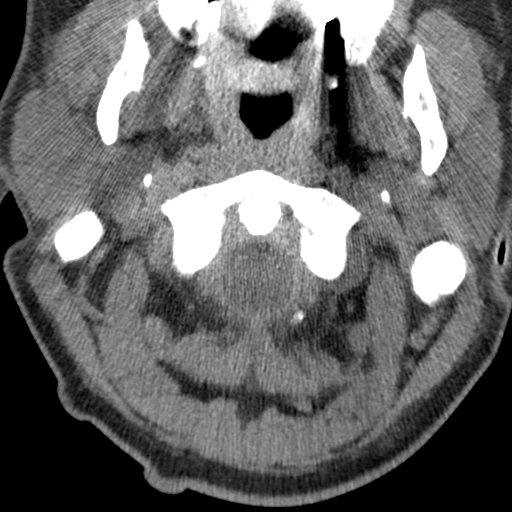

[Series 304: coronal, idose (2) · coronal · 0.31mm/px · 3 of 47 slices shown]
[im 10/47  bone]
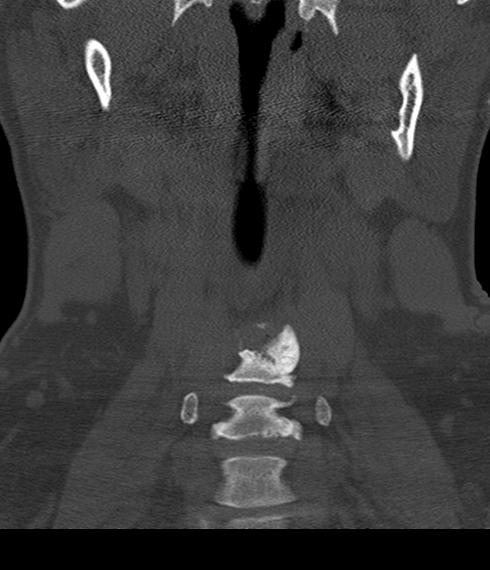
[im 19/47  bone]
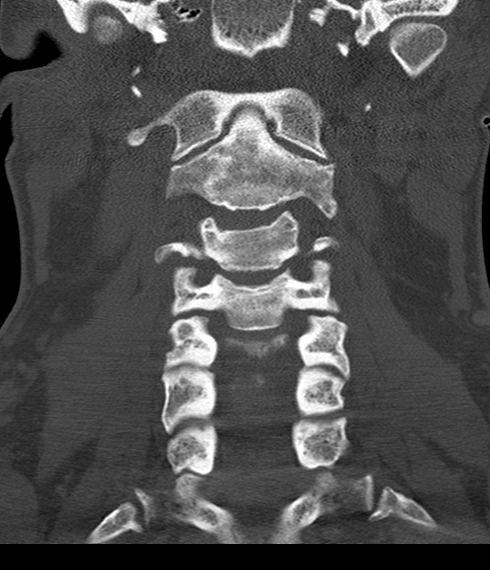
[im 28/47  bone]
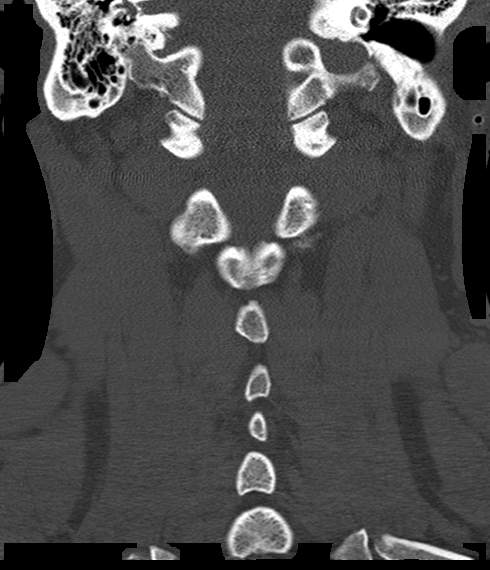

[Series 305: sagittal, idose (2) · sagittal · 0.30mm/px · 5 of 44 slices shown, 6 images]
[im 15/44  bone]
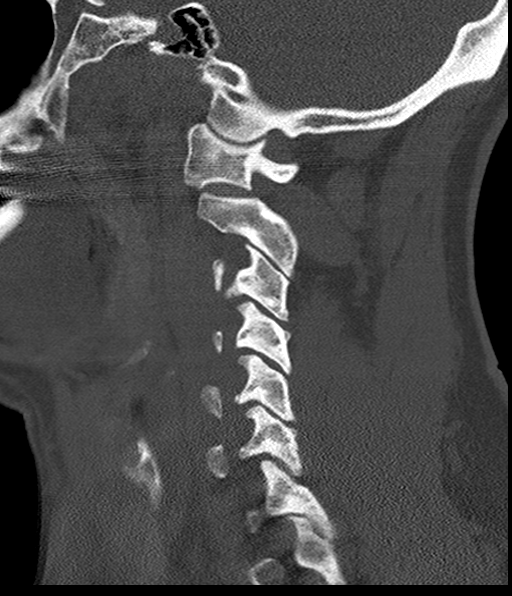
[im 18/44  bone]
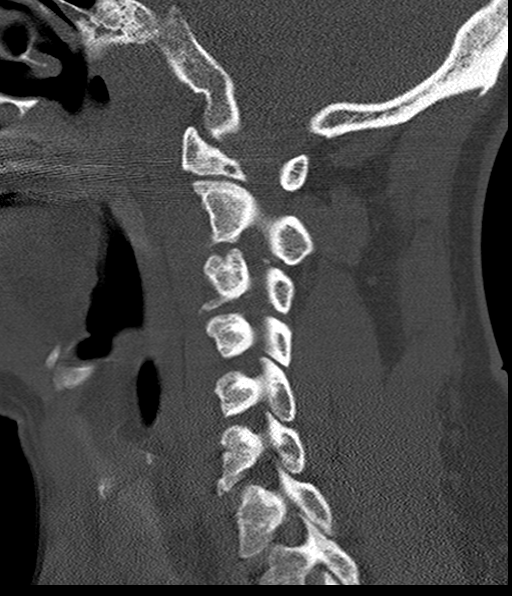
[im 22/44  soft-tissue]
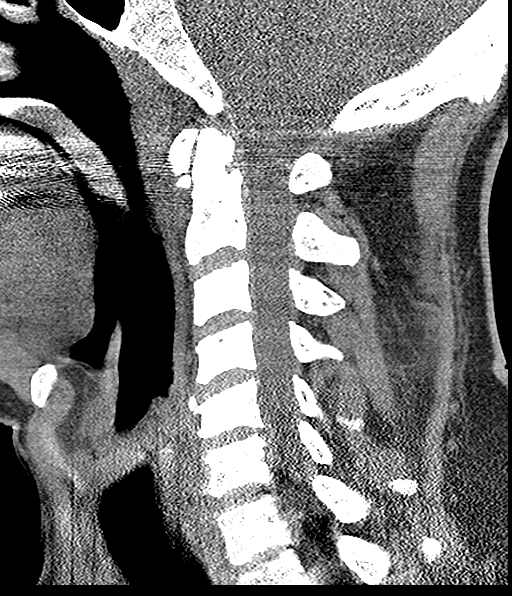
[im 22/44  bone]
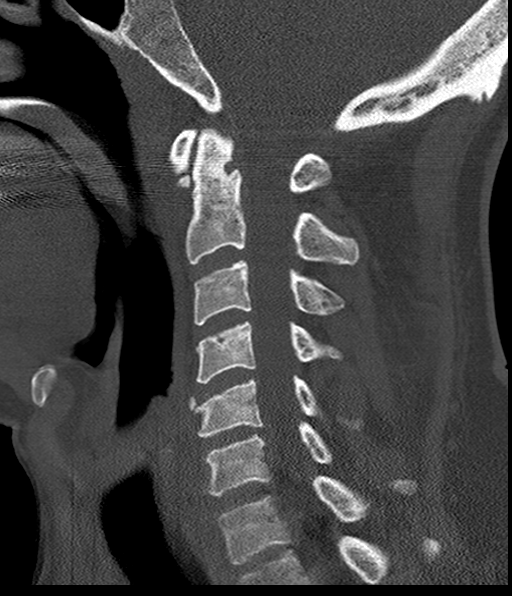
[im 26/44  bone]
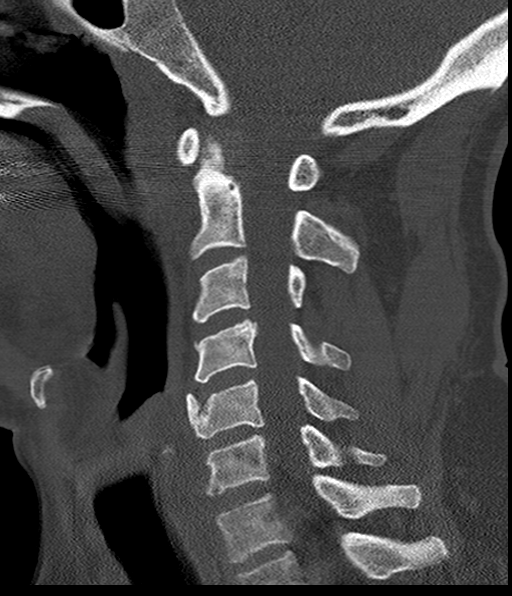
[im 29/44  bone]
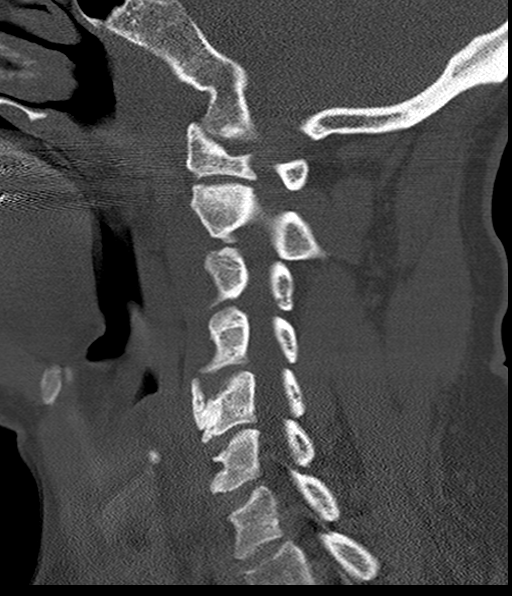

[Series 306: orthogonals, idose (2) · axial · 0.30mm/px · z∈[-175,-120]mm · 2 of 84 slices shown, 3 images]
[im 28/84  soft-tissue]
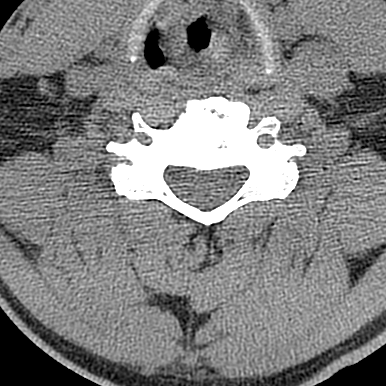
[im 28/84  bone]
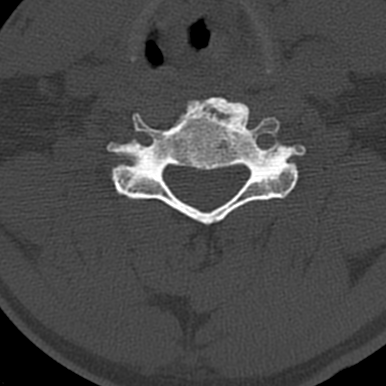
[im 56/84  bone]
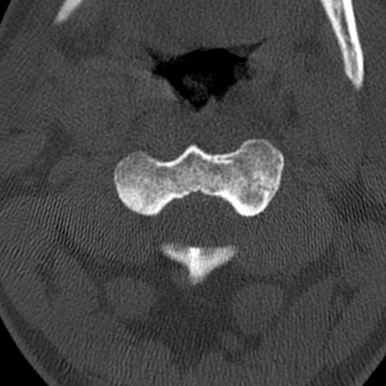

[13 of 33 positions shown; findings below may reference images not displayed]

FINDINGS: CT HEAD FINDINGS

The ventricles and sulci are appropriate in size for the patient's
age. There is no intracranial hemorrhage. No mass effect or midline
shift identified. The gray-white matter differentiation is
preserved. There is no extra-axial fluid collection.

The visualized paranasal sinuses and mastoid air cells are well
aerated. The calvarium is intact. Right posterior scalp hematoma.

CT CERVICAL SPINE FINDINGS

There is no acute fracture or subluxation of the cervical
spine.There multilevel degenerative changes with mild loss of
vertebral body heights most prominent at C4-C6. C5 anterior
osteophyte noted. There is sclerotic and irregularity of the tip of
the odontoid process, chronic. A small bony fragment is noted
inferior to the anterior arch of C1 which may be related to an old
cortical fracture/injury. The odontoid and spinous processes are
intact.There is normal anatomic alignment of the C1-C2 lateral
masses. The visualized soft tissues appear unremarkable.
IMPRESSION: No acute intracranial pathology.

No acute cervical spine fracture.

## 2017-07-25 MED FILL — HYDROCHLOROTHIAZIDE 25 MG T: 25 | 30 days supply | Qty: 30 | Fill #2

## 2017-07-25 MED FILL — LISINOPRIL 40 MG TAB: 40 | 30 days supply | Qty: 30 | Fill #2

## 2017-07-25 MED FILL — AMLODIPINE BESYLATE 10 MG T: 10 | 30 days supply | Qty: 30 | Fill #2

## 2017-08-24 ENCOUNTER — Other Ambulatory Visit: Payer: Self-pay | Admitting: Family Medicine

## 2017-08-24 DIAGNOSIS — I10 Essential (primary) hypertension: Secondary | ICD-10-CM

## 2017-09-01 MED FILL — HYDROCHLOROTHIAZIDE 25 MG T: 25 | 30 days supply | Qty: 30 | Fill #0

## 2017-09-01 MED FILL — LISINOPRIL 40 MG TAB: 40 | 30 days supply | Qty: 30 | Fill #0

## 2017-09-01 MED FILL — AMLODIPINE BESYLATE 10 MG T: 10 | 30 days supply | Qty: 30 | Fill #0

## 2017-10-06 ENCOUNTER — Ambulatory Visit: Payer: No Typology Code available for payment source | Attending: Family Medicine | Admitting: Family Medicine

## 2017-10-06 ENCOUNTER — Other Ambulatory Visit: Payer: Self-pay

## 2017-10-06 ENCOUNTER — Encounter: Payer: Self-pay | Admitting: Family Medicine

## 2017-10-06 VITALS — BP 117/73 | HR 100 | Temp 98.6°F | Ht 67.0 in | Wt 162.0 lb

## 2017-10-06 DIAGNOSIS — I10 Essential (primary) hypertension: Secondary | ICD-10-CM | POA: Diagnosis not present

## 2017-10-06 DIAGNOSIS — Z79899 Other long term (current) drug therapy: Secondary | ICD-10-CM | POA: Insufficient documentation

## 2017-10-06 DIAGNOSIS — Z9111 Patient's noncompliance with dietary regimen: Secondary | ICD-10-CM | POA: Insufficient documentation

## 2017-10-06 DIAGNOSIS — E785 Hyperlipidemia, unspecified: Secondary | ICD-10-CM | POA: Insufficient documentation

## 2017-10-06 DIAGNOSIS — F17210 Nicotine dependence, cigarettes, uncomplicated: Secondary | ICD-10-CM | POA: Diagnosis not present

## 2017-10-06 DIAGNOSIS — Z760 Encounter for issue of repeat prescription: Secondary | ICD-10-CM | POA: Diagnosis present

## 2017-10-06 DIAGNOSIS — Z9119 Patient's noncompliance with other medical treatment and regimen: Secondary | ICD-10-CM | POA: Diagnosis not present

## 2017-10-06 DIAGNOSIS — F17200 Nicotine dependence, unspecified, uncomplicated: Secondary | ICD-10-CM

## 2017-10-06 MED ORDER — AMLODIPINE BESYLATE 10 MG PO TABS: 10.0000 mg | ORAL_TABLET | Freq: Every day | ORAL | 5 refills | Status: DC

## 2017-10-06 MED ORDER — LISINOPRIL 40 MG PO TABS: 40.0000 mg | ORAL_TABLET | Freq: Every day | ORAL | 5 refills | Status: DC

## 2017-10-06 MED ORDER — HYDROCHLOROTHIAZIDE 25 MG PO TABS: 25.0000 mg | ORAL_TABLET | Freq: Every day | ORAL | 5 refills | Status: DC

## 2017-10-06 MED ORDER — NICOTINE 21 MG/24HR TD PT24: 21.0000 mg | MEDICATED_PATCH | Freq: Every day | TRANSDERMAL | 1 refills | Status: DC

## 2017-10-06 MED FILL — LISINOPRIL 40 MG TAB: 40 | 30 days supply | Qty: 30 | Fill #0

## 2017-10-06 MED FILL — AMLODIPINE BESYLATE 10 MG T: 10 | 30 days supply | Qty: 30 | Fill #0

## 2017-10-06 MED FILL — HYDROCHLOROTHIAZIDE 25 MG T: 25 | 30 days supply | Qty: 30 | Fill #0

## 2017-10-06 NOTE — Patient Instructions (Signed)
Managing Your Hypertension Hypertension is commonly called high blood pressure. This is when the force of your blood pressing against the walls of your arteries is too strong. Arteries are blood vessels that carry blood from your heart throughout your body. Hypertension forces the heart to work harder to pump blood, and may cause the arteries to become narrow or stiff. Having untreated or uncontrolled hypertension can cause heart attack, stroke, kidney disease, and other problems. What are blood pressure readings? A blood pressure reading consists of a higher number over a lower number. Ideally, your blood pressure should be below 120/80. The first ("top") number is called the systolic pressure. It is a measure of the pressure in your arteries as your heart beats. The second ("bottom") number is called the diastolic pressure. It is a measure of the pressure in your arteries as the heart relaxes. What does my blood pressure reading mean? Blood pressure is classified into four stages. Based on your blood pressure reading, your health care provider may use the following stages to determine what type of treatment you need, if any. Systolic pressure and diastolic pressure are measured in a unit called mm Hg. Normal  Systolic pressure: below 120.  Diastolic pressure: below 80. Elevated  Systolic pressure: 120-129.  Diastolic pressure: below 80. Hypertension stage 1  Systolic pressure: 130-139.  Diastolic pressure: 80-89. Hypertension stage 2  Systolic pressure: 140 or above.  Diastolic pressure: 90 or above. What health risks are associated with hypertension? Managing your hypertension is an important responsibility. Uncontrolled hypertension can lead to:  A heart attack.  A stroke.  A weakened blood vessel (aneurysm).  Heart failure.  Kidney damage.  Eye damage.  Metabolic syndrome.  Memory and concentration problems.  What changes can I make to manage my  hypertension? Hypertension can be managed by making lifestyle changes and possibly by taking medicines. Your health care provider will help you make a plan to bring your blood pressure within a normal range. Eating and drinking  Eat a diet that is high in fiber and potassium, and low in salt (sodium), added sugar, and fat. An example eating plan is called the DASH (Dietary Approaches to Stop Hypertension) diet. To eat this way: ? Eat plenty of fresh fruits and vegetables. Try to fill half of your plate at each meal with fruits and vegetables. ? Eat whole grains, such as whole wheat pasta, brown rice, or whole grain bread. Fill about one quarter of your plate with whole grains. ? Eat low-fat diary products. ? Avoid fatty cuts of meat, processed or cured meats, and poultry with skin. Fill about one quarter of your plate with lean proteins such as fish, chicken without skin, beans, eggs, and tofu. ? Avoid premade and processed foods. These tend to be higher in sodium, added sugar, and fat.  Reduce your daily sodium intake. Most people with hypertension should eat less than 1,500 mg of sodium a day.  Limit alcohol intake to no more than 1 drink a day for nonpregnant women and 2 drinks a day for men. One drink equals 12 oz of beer, 5 oz of wine, or 1 oz of hard liquor. Lifestyle  Work with your health care provider to maintain a healthy body weight, or to lose weight. Ask what an ideal weight is for you.  Get at least 30 minutes of exercise that causes your heart to beat faster (aerobic exercise) most days of the week. Activities may include walking, swimming, or biking.  Include exercise   to strengthen your muscles (resistance exercise), such as weight lifting, as part of your weekly exercise routine. Try to do these types of exercises for 30 minutes at least 3 days a week.  Do not use any products that contain nicotine or tobacco, such as cigarettes and e-cigarettes. If you need help quitting, ask  your health care provider.  Control any long-term (chronic) conditions you have, such as high cholesterol or diabetes. Monitoring  Monitor your blood pressure at home as told by your health care provider. Your personal target blood pressure may vary depending on your medical conditions, your age, and other factors.  Have your blood pressure checked regularly, as often as told by your health care provider. Working with your health care provider  Review all the medicines you take with your health care provider because there may be side effects or interactions.  Talk with your health care provider about your diet, exercise habits, and other lifestyle factors that may be contributing to hypertension.  Visit your health care provider regularly. Your health care provider can help you create and adjust your plan for managing hypertension. Will I need medicine to control my blood pressure? Your health care provider may prescribe medicine if lifestyle changes are not enough to get your blood pressure under control, and if:  Your systolic blood pressure is 130 or higher.  Your diastolic blood pressure is 80 or higher.  Take medicines only as told by your health care provider. Follow the directions carefully. Blood pressure medicines must be taken as prescribed. The medicine does not work as well when you skip doses. Skipping doses also puts you at risk for problems. Contact a health care provider if:  You think you are having a reaction to medicines you have taken.  You have repeated (recurrent) headaches.  You feel dizzy.  You have swelling in your ankles.  You have trouble with your vision. Get help right away if:  You develop a severe headache or confusion.  You have unusual weakness or numbness, or you feel faint.  You have severe pain in your chest or abdomen.  You vomit repeatedly.  You have trouble breathing. Summary  Hypertension is when the force of blood pumping through  your arteries is too strong. If this condition is not controlled, it may put you at risk for serious complications.  Your personal target blood pressure may vary depending on your medical conditions, your age, and other factors. For most people, a normal blood pressure is less than 120/80.  Hypertension is managed by lifestyle changes, medicines, or both. Lifestyle changes include weight loss, eating a healthy, low-sodium diet, exercising more, and limiting alcohol. This information is not intended to replace advice given to you by your health care provider. Make sure you discuss any questions you have with your health care provider. Document Released: 05/02/2012 Document Revised: 07/06/2016 Document Reviewed: 07/06/2016 Elsevier Interactive Patient Education  2018 [employer]  Nicotine skin patches What is this medicine? NICOTINE (NIK oh teen) helps people stop smoking. The patches replace the nicotine found in cigarettes and help to decrease withdrawal effects. They are most effective when used in combination with a stop-smoking program. This medicine may be used for other purposes; ask your health care provider or pharmacist if you have questions. COMMON BRAND NAME(S): Habitrol, Nicoderm CQ, Nicotrol What should I tell my health care provider before I take this medicine? They need to know if you have any of these conditions: -diabetes -heart disease, angina, irregular heartbeat   or previous heart attack -high blood pressure -lung disease, including asthma -overactive thyroid -pheochromocytoma -seizures or a history of seizures -skin problems, like eczema -stomach problems or ulcers -an unusual or allergic reaction to nicotine, adhesives, other medicines, foods, dyes, or preservatives -pregnant or trying to get pregnant -breast-feeding How should I use this medicine? This medicine is for use on the skin. Follow the directions that come with the patches. Find an area of skin on your  upper arm, chest, or back that is clean, dry, greaseless, undamaged and hairless. Wash hands with plain soap and water. Do not use anything that contains aloe, lanolin or glycerin as these may prevent the patch from sticking. Dry thoroughly. Remove the patch from the sealed pouch. Do not try to cut or trim the patch. Using your palm, press the patch firmly in place for 10 seconds to make sure that there is good contact with your skin. After applying the patch, wash your hands. Change the patch every day, keeping to a regular schedule. When you apply a new patch, use a new area of skin. Wait at least 1 week before using the same area again. Talk to your pediatrician regarding the use of this medicine in children. Special care may be needed. Overdosage: If you think you have taken too much of this medicine contact a poison control center or emergency room at once. NOTE: This medicine is only for you. Do not share this medicine with others. What if I miss a dose? If you forget to replace a patch, use it as soon as you can. Only use one patch at a time and do not leave on the skin for longer than directed. If a patch falls off, you can replace it, but keep to your schedule and remove the patch at the right time. What may interact with this medicine? -medicines for asthma -medicines for blood pressure -medicines for mental depression This list may not describe all possible interactions. Give your health care provider a list of all the medicines, herbs, non-prescription drugs, or dietary supplements you use. Also tell them if you smoke, drink alcohol, or use illegal drugs. Some items may interact with your medicine. What should I watch for while using this medicine? You should begin using the nicotine patch the day you stop smoking. It is okay if you do not succeed at your attempt to quit and have a cigarette. You can still continue your quit attempt and keep using the product as directed. Just throw away your  cigarettes and get back to your quit plan. You can keep the patch in place during swimming, bathing, and showering. If your patch falls off during these activities, replace it. When you first apply the patch, your skin may itch or burn. This should go away soon. When you remove a patch, the skin may look red, but this should only last for a few days. Call your doctor or health care professional if skin redness does not go away after 4 days, if your skin swells, or if you get a rash. If you are a diabetic and you quit smoking, the effects of insulin may be increased and you may need to reduce your insulin dose. Check with your doctor or health care professional about how you should adjust your insulin dose. If you are going to have a magnetic resonance imaging (MRI) procedure, tell your MRI technician if you have this patch on your body. It must be removed before a MRI. What side effects may   I notice from receiving this medicine? Side effects that you should report to your doctor or health care professional as soon as possible: -allergic reactions like skin rash, itching or hives, swelling of the face, lips, or tongue -breathing problems -changes in hearing -changes in vision -chest pain -cold sweats -confusion -fast, irregular heartbeat -feeling faint or lightheaded, falls -headache -increased saliva -skin redness that lasts more than 4 days -stomach pain -signs and symptoms of nicotine overdose like nausea; vomiting; dizziness; weakness; and rapid heartbeat Side effects that usually do not require medical attention (report to your doctor or health care professional if they continue or are bothersome): -diarrhea -dry mouth -hiccups -irritability -nervousness or restlessness -trouble sleeping or vivid dreams This list may not describe all possible side effects. Call your doctor for medical advice about side effects. You may report side effects to FDA at 1-800-FDA-1088. Where should I keep  my medicine? Keep out of the reach of children. Store at room temperature between 20 and 25 degrees C (68 and 77 degrees F). Protect from heat and light. Store in manufacturers packaging until ready to use. Throw away unused medicine after the expiration date. When you remove a patch, fold with sticky sides together; put in an empty opened pouch and throw away. NOTE: This sheet is a summary. It may not cover all possible information. If you have questions about this medicine, talk to your doctor, pharmacist, or health care provider.  2018 Elsevier/Gold Standard (2014-07-07 15:46:21)  

## 2017-10-06 NOTE — Progress Notes (Signed)
   Subjective:  Patient ID: Jimmy Zimmerman Amato, male    DOB: 1966/03/18  Age: 52 y.o. MRN: [id number]  CC: Medication Refill   HPI Jimmy Zimmerman Rountree presents for hypertension. He is not exercising and is not adherent to low salt diet.  He does not check BP at home. Cardiac symptoms none. Patient denies chest pain, chest pressure/discomfort, claudication, dyspnea, lower extremity edema, near-syncope, palpitations and syncope.  Cardiovascular risk factors: dyslipidemia, hypertension, male gender, smoking/tobacco exposure, and sedentary lifestyle. He reports smoking 1/2 pack per day for 14 years. He is interesting in patches to help quit smoking. Use of agents associated with hypertension: none. History of target. organ damage: none.    Outpatient Medications Prior to Visit  Medication Sig Dispense Refill  . amLODipine (NORVASC) 10 MG tablet TAKE 1 TABLET (10 MG TOTAL) BY MOUTH DAILY. 30 tablet 0  . hydrochlorothiazide (HYDRODIURIL) 25 MG tablet TAKE 1 TABLET BY MOUTH DAILY. 30 tablet 0  . lisinopril (PRINIVIL,ZESTRIL) 40 MG tablet TAKE 1 TABLET BY MOUTH DAILY. 30 tablet 0  . guaiFENesin (MUCINEX) 600 MG 12 hr tablet Take 1 tablet (600 mg total) by mouth 2 (two) times daily. For 7 days. (Patient not taking: Reported on 10/06/2017)    . guaiFENesin-dextromethorphan (ROBITUSSIN DM) 100-10 MG/5ML syrup Take 5 mLs by mouth every 4 (four) hours as needed for cough. (Patient not taking: Reported on 10/06/2017) 236 mL 0   No facility-administered medications prior to visit.    Review of Systems  Constitutional: Negative.   Eyes: Negative.   Respiratory: Negative.   Cardiovascular: Negative.   Gastrointestinal: Negative.   Skin: Negative.   Neurological: Negative for syncope and headaches.    Objective:  BP 117/73 (BP Location: Left Arm, Patient Position: Sitting, Cuff Size: Large)   Pulse 100   Temp 98.6 F (37 C) (Oral)   Ht 5\' 7"  (1.702 m)   Wt 162 lb (73.5 kg)   SpO2 96%   BMI 25.37 kg/m    BP/Weight 10/06/2017 05/18/2017 06/10/2015  Systolic BP 117 158 134  Diastolic BP 73 92 93  Wt. (Lbs) 162 174.4 154  BMI 25.37 27.31 25.63   Physical Exam  Nursing note and vitals reviewed. Constitutional: He appears well-developed and well-nourished.  Eyes: Conjunctivae are normal. Pupils are equal, round, and reactive to light.  Neck: Normal range of motion. Neck supple. No JVD present.  Cardiovascular: Normal rate, regular rhythm, normal heart sounds and intact distal pulses.  Respiratory: Effort normal and breath sounds normal.  GI: Soft. Bowel sounds are normal. There is no tenderness.  Skin: Skin is warm and dry.  Psychiatric: He has a normal mood and affect.    Assessment & Plan:   1. Essential hypertension Continue current  medications.  - lisinopril (PRINIVIL,ZESTRIL) 40 MG tablet; Take 1 tablet (40 mg total) by mouth daily.  Dispense: 30 tablet; Refill: 5 - hydrochlorothiazide (HYDRODIURIL) 25 MG tablet; Take 1 tablet (25 mg total) by mouth daily.  Dispense: 30 tablet; Refill: 5 - amLODipine (NORVASC) 10 MG tablet; Take 1 tablet (10 mg total) by mouth daily.  Dispense: 30 tablet; Refill: 5  2. Ready to quit smoking  - nicotine (NICODERM CQ) 21 mg/24hr patch; Place 1 patch (21 mg total) onto the skin daily.  Dispense: 28 patch; Refill: 1      Follow-up: Return in about 6 months (around 04/05/2018) for HTN.   Lizbeth BarkMandesia R Hairston FNP

## 2017-10-06 NOTE — Progress Notes (Signed)
Need medication RF Cough-

## 2017-11-08 MED FILL — AMLODIPINE BESYLATE 10 MG T: 10 | 30 days supply | Qty: 30 | Fill #1

## 2017-11-08 MED FILL — LISINOPRIL 40 MG TAB: 40 | 30 days supply | Qty: 30 | Fill #1

## 2017-11-08 MED FILL — HYDROCHLOROTHIAZIDE 25 MG T: 25 | 30 days supply | Qty: 30 | Fill #1

## 2017-12-02 ENCOUNTER — Encounter (HOSPITAL_COMMUNITY): Payer: Self-pay

## 2017-12-02 ENCOUNTER — Emergency Department (HOSPITAL_COMMUNITY): Payer: No Typology Code available for payment source

## 2017-12-02 ENCOUNTER — Other Ambulatory Visit: Payer: Self-pay

## 2017-12-02 ENCOUNTER — Emergency Department (HOSPITAL_COMMUNITY)
Admission: EM | Admit: 2017-12-02 | Discharge: 2017-12-02 | Disposition: A | Discharge status: Home / Self Care | Payer: No Typology Code available for payment source | Attending: Emergency Medicine | Admitting: Emergency Medicine

## 2017-12-02 DIAGNOSIS — Z79899 Other long term (current) drug therapy: Secondary | ICD-10-CM | POA: Insufficient documentation

## 2017-12-02 DIAGNOSIS — I10 Essential (primary) hypertension: Secondary | ICD-10-CM | POA: Diagnosis not present

## 2017-12-02 DIAGNOSIS — F17210 Nicotine dependence, cigarettes, uncomplicated: Secondary | ICD-10-CM | POA: Insufficient documentation

## 2017-12-02 DIAGNOSIS — R109 Unspecified abdominal pain: Secondary | ICD-10-CM | POA: Diagnosis present

## 2017-12-02 DIAGNOSIS — K852 Alcohol induced acute pancreatitis without necrosis or infection: Secondary | ICD-10-CM | POA: Insufficient documentation

## 2017-12-02 HISTORY — DX: Acute pancreatitis without necrosis or infection, unspecified: K85.90

## 2017-12-02 LAB — COMPREHENSIVE METABOLIC PANEL
ALK PHOS: 55 U/L (ref 38–126)
ALT: 36 U/L (ref 17–63)
ANION GAP: 13 (ref 5–15)
AST: 28 U/L (ref 15–41)
Albumin: 4 g/dL (ref 3.5–5.0)
BUN: <5 mg/dL — ABNORMAL LOW (ref 6–20)
CALCIUM: 9.1 mg/dL (ref 8.9–10.3)
CO2: 23 mmol/L (ref 22–32)
Chloride: 94 mmol/L — ABNORMAL LOW (ref 101–111)
Creatinine, Ser: 0.73 mg/dL (ref 0.61–1.24)
GFR calc non Af Amer: >60 mL/min (ref >60)
Glucose, Bld: 88 mg/dL (ref 65–99)
Potassium: 3 mmol/L — ABNORMAL LOW (ref 3.5–5.1)
Sodium: 130 mmol/L — ABNORMAL LOW (ref 135–145)
TOTAL PROTEIN: 7 g/dL (ref 6.5–8.1)
Total Bilirubin: 1.8 mg/dL — ABNORMAL HIGH (ref 0.3–1.2)

## 2017-12-02 LAB — URINALYSIS, ROUTINE W REFLEX MICROSCOPIC
BILIRUBIN URINE: NEGATIVE
GLUCOSE, UA: NEGATIVE mg/dL
HGB URINE DIPSTICK: NEGATIVE
KETONES UR: 5 mg/dL — AB
Leukocytes, UA: NEGATIVE
NITRITE: NEGATIVE
Protein, ur: 30 mg/dL — AB
Specific Gravity, Urine: 1.014 (ref 1.005–1.030)
pH: 6 (ref 5.0–8.0)

## 2017-12-02 LAB — CBC
HCT: 41.6 % (ref 39.0–52.0)
HEMOGLOBIN: 14.7 g/dL (ref 13.0–17.0)
MCH: 33.1 pg (ref 26.0–34.0)
MCHC: 35.3 g/dL (ref 30.0–36.0)
MCV: 93.7 fL (ref 78.0–100.0)
Platelets: 218 10*3/uL (ref 150–400)
RBC: 4.44 MIL/uL (ref 4.22–5.81)
RDW: 13.9 % (ref 11.5–15.5)
WBC: 7.7 10*3/uL (ref 4.0–10.5)

## 2017-12-02 LAB — LIPASE, BLOOD: Lipase: 59 U/L — ABNORMAL HIGH (ref 11–51)

## 2017-12-02 MED ORDER — IOPAMIDOL (ISOVUE-300) INJECTION 61%
INTRAVENOUS | Status: AC
Start: 2017-12-02 — End: 2017-12-02
  Administered 2017-12-02: 100 mL
  Filled 2017-12-02: qty 100

## 2017-12-02 MED ORDER — KETOROLAC TROMETHAMINE 30 MG/ML IJ SOLN
30.0000 mg | Freq: Once | INTRAMUSCULAR | Status: AC
  Administered 2017-12-02: 30 mg via INTRAVENOUS
  Filled 2017-12-02: qty 1

## 2017-12-02 MED ORDER — MORPHINE SULFATE (PF) 4 MG/ML IV SOLN
4.0000 mg | Freq: Once | INTRAVENOUS | Status: AC
  Administered 2017-12-02: 4 mg via INTRAVENOUS
  Filled 2017-12-02: qty 1

## 2017-12-02 MED ORDER — TRAMADOL HCL 50 MG PO TABS: 50.0000 mg | ORAL_TABLET | Freq: Four times a day (QID) | ORAL | 0 refills | Status: DC | PRN

## 2017-12-02 MED ORDER — SODIUM CHLORIDE 0.9 % IV BOLUS
1000.0000 mL | Freq: Once | INTRAVENOUS | Status: AC
  Administered 2017-12-02: 1000 mL via INTRAVENOUS

## 2017-12-02 NOTE — ED Notes (Signed)
Patient transported to CT 

## 2017-12-02 NOTE — ED Notes (Signed)
Patient verbalizes understanding of discharge instructions. Opportunity for questioning and answers were provided. Armband removed by staff, pt discharged from ED. Pt ambulated to ED lobby with steady gait.

## 2017-12-02 NOTE — Discharge Instructions (Addendum)
Return here as needed. Follow up with your primary doctor. Follow a bland diet over the next 48 hours.

## 2017-12-02 NOTE — ED Triage Notes (Signed)
Pt states upper abd pain for the past few days, denies n/v/d, hx of pancreatitis, reports ETOH use yesterday

## 2017-12-02 NOTE — ED Provider Notes (Signed)
[facility] Provider Note   CSN: [id number] Arrival date & time: 12/02/17  [id number]     History   Chief Complaint Chief Complaint  Patient presents with  . Pancreatitis    HPI Rogelia BogaJames D Siracusa is a 52 y.o. male.  HPI Patient presents to the emergency department with mid abdominal pain that started 2 days ago.  The patient states he does have a history of pancreatitis.  He states that he normally drinks 2 40 ounce beers and 2 12 ounce beers a day.  The patient states that his last alcoholic drink was yesterday.  The patient states that nothing seems to make the condition better.  He states he did not take any medications prior to arrival.  He states that this feels similar to his pancreatitis in the past.  The patient denies chest pain, shortness of breath, headache,blurred vision, neck pain, fever, cough, weakness, numbness, dizziness, anorexia, edema, nausea, vomiting,rash, back pain, dysuria, hematemesis, bloody stool, near syncope, or syncope. Past Medical History:  Diagnosis Date  . Hypertension   . Pancreatitis     Patient Active Problem List   Diagnosis Date Noted  . Elevated liver enzymes   . Alcoholic ketoacidosis   . Abnormal US (ultrasound) of abdomen 04/19/2015  . Acute pancreatitis 04/18/2015  . Alcohol abuse 04/18/2015  . Hypokalemia 04/18/2015  . Essential hypertension 05/27/2014  . Nicotine dependence 05/27/2014  . Family history of diabetes mellitus (DM) 05/27/2014    Past Surgical History:  Procedure Laterality Date  . HEMORRHOID SURGERY    . right knee surgery           Home Medications    Prior to Admission medications   Medication Sig Start Date End Date Taking? Authorizing Provider  amLODipine (NORVASC) 10 MG tablet Take 1 tablet (10 mg total) by mouth daily. 10/06/17   Lizbeth BarkHairston, Mandesia R, FNP  hydrochlorothiazide (HYDRODIURIL) 25 MG tablet Take 1 tablet (25 mg total) by mouth daily. 10/06/17   Lizbeth BarkHairston,  Mandesia R, FNP  lisinopril (PRINIVIL,ZESTRIL) 40 MG tablet Take 1 tablet (40 mg total) by mouth daily. 10/06/17   Lizbeth BarkHairston, Mandesia R, FNP  nicotine (NICODERM CQ) 21 mg/24hr patch Place 1 patch (21 mg total) onto the skin daily. 10/06/17   Lizbeth BarkHairston, Mandesia R, FNP    Family History Family History  Problem Relation Age of Onset  . Hypertension Mother   . Diabetes Mother   . Stroke Maternal Grandmother     Social History Social History   Tobacco Use  . Smoking status: Current Every Day Smoker    Packs/day: 0.50    Years: 30.00    Pack years: 15.00    Types: Cigarettes  . Smokeless tobacco: Never Used  Substance Use Topics  . Alcohol use: Yes    Alcohol/week: 3.6 oz    Types: 6 Cans of beer per week    Comment: everyday   . Drug use: No     Allergies   Patient has no known allergies.   Review of Systems Review of Systems All other systems negative except as documented in the HPI. All pertinent positives and negatives as reviewed in the HPI.  Physical Exam Updated Vital Signs BP 111/70 (BP Location: Right Arm)   Pulse 87   Temp 97.6 F (36.4 C) (Oral)   Resp 16   SpO2 98%   Physical Exam  Constitutional: He is oriented to person, place, and time. He appears well-developed and well-nourished. No distress.  HENT:  Head: Normocephalic and atraumatic.  Mouth/Throat: Oropharynx is clear and moist.  Eyes: Pupils are equal, round, and reactive to light.  Neck: Normal range of motion. Neck supple.  Cardiovascular: Normal rate, regular rhythm and normal heart sounds. Exam reveals no gallop and no friction rub.  No murmur heard. Pulmonary/Chest: Effort normal and breath sounds normal. No respiratory distress. He has no wheezes.  Abdominal: Soft. Bowel sounds are normal. He exhibits no distension and no mass. There is tenderness. There is no guarding.  Neurological: He is alert and oriented to person, place, and time. He exhibits normal muscle tone. Coordination normal.    Skin: Skin is warm and dry. Capillary refill takes less than 2 seconds. No rash noted. No erythema.  Psychiatric: He has a normal mood and affect. His behavior is normal.  Nursing note and vitals reviewed.    ED Treatments / Results  Labs (all labs ordered are listed, but only abnormal results are displayed) Labs Reviewed  LIPASE, BLOOD - Abnormal; Notable for the following components:      Result Value   Lipase 59 (*)    All other components within normal limits  COMPREHENSIVE METABOLIC PANEL - Abnormal; Notable for the following components:   Sodium 130 (*)    Potassium 3.0 (*)    Chloride 94 (*)    BUN <5 (*)    Total Bilirubin 1.8 (*)    All other components within normal limits  URINALYSIS, ROUTINE W REFLEX MICROSCOPIC - Abnormal; Notable for the following components:   Ketones, ur 5 (*)    Protein, ur 30 (*)    Bacteria, UA RARE (*)    Squamous Epithelial / LPF 0-5 (*)    All other components within normal limits  CBC    EKG None  Radiology Ct Abdomen Pelvis W Contrast  Result Date: 12/02/2017 CLINICAL DATA:  Acute abdominal pain. Pancreatitis. Alcohol misuse. EXAM: CT ABDOMEN AND PELVIS WITH CONTRAST TECHNIQUE: Multidetector CT imaging of the abdomen and pelvis was performed using the standard protocol following bolus administration of intravenous contrast. CONTRAST:  ISOVUE-300 IOPAMIDOL (ISOVUE-300) INJECTION 61% COMPARISON:  04/19/2015 FINDINGS: Lower Chest: No acute findings. Hepatobiliary: Decreased hepatic steatosis since prior study. Focal fatty infiltration seen adjacent to the falciform ligament. Multiple benign hemangiomas in both the right and left hepatic lobes remains stable. No new or enlarging liver masses identified. Gallbladder is unremarkable. No evidence of biliary ductal dilatation. Pancreas: Ill-defined decreased attenuation is seen in the groove between the pancreatic head and duodenum, which measures 3.6 x 2.0 cm on image 31/3 compared to 4.5  x 3.2 cm previously. This is most consistent with groove pancreatitis, which is less pronounced than on prior study. No evidence of pancreatic ductal dilatation or calcification. No evidence of peripancreatic fluid collections. Spleen: Within normal limits in size and appearance. Adrenals/Urinary Tract: Stable bilateral adrenal masses, consistent with benign adenomas. No evidence of renal masses or hydronephrosis. Unremarkable unopacified urinary bladder. Stomach/Bowel: No evidence of obstruction, inflammatory process or abnormal fluid collections. Vascular/Lymphatic: No pathologically enlarged lymph nodes. No abdominal aortic aneurysm. Aortic atherosclerosis. Reproductive:  No mass or other significant abnormality. Other:  None. Musculoskeletal:  No suspicious bone lesions identified. IMPRESSION: Findings consistent with mild groove pancreatitis. No evidence of pancreatic pseudocyst or other complication. Decreased hepatic steatosis since prior study. Stable benign hepatic hemangiomas. Stable benign bilateral adrenal adenomas.  The Electronically Signed   By: Myles Rosenthal M.D.   On: 12/02/2017 11:13    Procedures Procedures (  including critical care time)  Medications Ordered in ED Medications  morphine 4 MG/ML injection 4 mg (4 mg Intravenous Given 12/02/17 1016)  sodium chloride 0.9 % bolus 1,000 mL (0 mLs Intravenous Stopped 12/02/17 1141)  iopamidol (ISOVUE-300) 61 % injection (100 mLs  Contrast Given 12/02/17 1032)  ketorolac (TORADOL) 30 MG/ML injection 30 mg (30 mg Intravenous Given 12/02/17 1237)     Initial Impression / Assessment and Plan / ED Course  I have reviewed the triage vital signs and the nursing notes.  Pertinent labs & imaging results that were available during my care of the patient were reviewed by me and considered in my medical decision making (see chart for details).    Patient will be treated for pancreatitis.  I do not feel at this time he needs to be admitted as he is  resting comfortably with no nausea or vomiting.  Patient is advised to return here for any worsening in his condition.  Also advised him to use a bland diet over the next 48 hours.  Did ask him to try to curtail some of his alcohol.  Patient agrees the plan and all questions were answered  Final Clinical Impressions(s) / ED Diagnoses   Final diagnoses:  None    ED Discharge Orders    None       Charlestine Night, PA-C 12/02/17 1309    Gerhard Munch, MD 12/03/17 [phone]

## 2017-12-12 MED FILL — AMLODIPINE BESYLATE 10 MG T: 10 | 30 days supply | Qty: 30 | Fill #2

## 2017-12-12 MED FILL — LISINOPRIL 40 MG TAB: 40 | 30 days supply | Qty: 30 | Fill #2

## 2017-12-12 MED FILL — HYDROCHLOROTHIAZIDE 25 MG T: 25 | 30 days supply | Qty: 30 | Fill #2

## 2018-01-10 MED FILL — AMLODIPINE BESYLATE 10 MG T: 10 | 30 days supply | Qty: 30 | Fill #3

## 2018-01-10 MED FILL — HYDROCHLOROTHIAZIDE 25 MG T: 25 | 30 days supply | Qty: 30 | Fill #3

## 2018-01-10 MED FILL — LISINOPRIL 40 MG TABLET: 40 | 30 days supply | Qty: 30 | Fill #3

## 2018-02-13 MED FILL — LISINOPRIL 40 MG TABLET: 40 | 30 days supply | Qty: 30 | Fill #4

## 2018-02-13 MED FILL — AMLODIPINE BESYLATE 10 MG T: 10 | 30 days supply | Qty: 30 | Fill #4

## 2018-02-13 MED FILL — HYDROCHLOROTHIAZIDE 25 MG T: 25 | 30 days supply | Qty: 30 | Fill #4

## 2018-03-14 MED FILL — LISINOPRIL 40 MG TABLET: 40 | 30 days supply | Qty: 30 | Fill #5

## 2018-03-14 MED FILL — AMLODIPINE BESYLATE 10 MG T: 10 | 30 days supply | Qty: 30 | Fill #5

## 2018-03-14 MED FILL — HYDROCHLOROTHIAZIDE 25 MG T: 25 | 30 days supply | Qty: 30 | Fill #5

## 2018-04-06 ENCOUNTER — Ambulatory Visit: Payer: No Typology Code available for payment source | Admitting: Family Medicine

## 2018-04-20 ENCOUNTER — Telehealth: Payer: Self-pay | Admitting: Family Medicine

## 2018-04-20 ENCOUNTER — Other Ambulatory Visit: Payer: Self-pay

## 2018-04-20 ENCOUNTER — Ambulatory Visit: Payer: No Typology Code available for payment source | Admitting: Family Medicine

## 2018-04-20 DIAGNOSIS — I10 Essential (primary) hypertension: Secondary | ICD-10-CM

## 2018-04-20 MED ORDER — LISINOPRIL 40 MG PO TABS: 40.0000 mg | ORAL_TABLET | Freq: Every day | ORAL | 0 refills | Status: DC

## 2018-04-20 MED ORDER — AMLODIPINE BESYLATE 10 MG PO TABS: 10.0000 mg | ORAL_TABLET | Freq: Every day | ORAL | 0 refills | Status: DC

## 2018-04-20 MED ORDER — HYDROCHLOROTHIAZIDE 25 MG PO TABS: 25.0000 mg | ORAL_TABLET | Freq: Every day | ORAL | 0 refills | Status: DC

## 2018-04-20 MED FILL — AMLODIPINE BESYLATE 10 MG T: 10 | 30 days supply | Qty: 30 | Fill #0

## 2018-04-20 MED FILL — LISINOPRIL 40 MG TABLET: 40 | 30 days supply | Qty: 30 | Fill #0

## 2018-04-20 MED FILL — HYDROCHLOROTHIAZIDE 25 MG T: 25 | 30 days supply | Qty: 30 | Fill #0

## 2018-04-20 NOTE — Telephone Encounter (Signed)
Called patient to see if he could come in this morning since his new PCP is out and patient stated that he was unable to come in and requesting refills on the following medication:  hydrochlorothiazide (HYDRODIURIL) 25 MG tablet  lisinopril (PRINIVIL,ZESTRIL) 40 MG tablet  amLODipine (NORVASC) 10 MG tablet   Patient has been rescheduled for 9/17 and [facility]

## 2018-04-20 NOTE — Telephone Encounter (Signed)
Pt had an appt scheduled today with Dr. Jillyn HiddenFulp that had to be rescheduled due to Dr. Jillyn HiddenFulp calling out today. He was able to make an appt on 9/17 and requested that we refill his BP meds so he has enough to last until the new appt

## 2018-05-08 ENCOUNTER — Encounter: Payer: Self-pay | Admitting: Family Medicine

## 2018-05-08 ENCOUNTER — Ambulatory Visit: Payer: No Typology Code available for payment source | Admitting: Family Medicine

## 2018-05-08 ENCOUNTER — Ambulatory Visit: Payer: Self-pay | Attending: Family Medicine | Admitting: Family Medicine

## 2018-05-08 ENCOUNTER — Other Ambulatory Visit: Payer: Self-pay

## 2018-05-08 VITALS — BP 151/80 | HR 109 | Temp 99.1°F | Resp 18 | Ht 67.0 in | Wt 153.4 lb

## 2018-05-08 DIAGNOSIS — R748 Abnormal levels of other serum enzymes: Secondary | ICD-10-CM

## 2018-05-08 DIAGNOSIS — I10 Essential (primary) hypertension: Secondary | ICD-10-CM

## 2018-05-08 DIAGNOSIS — F1029 Alcohol dependence with unspecified alcohol-induced disorder: Secondary | ICD-10-CM

## 2018-05-08 DIAGNOSIS — R000 Tachycardia, unspecified: Secondary | ICD-10-CM

## 2018-05-08 DIAGNOSIS — Z23 Encounter for immunization: Secondary | ICD-10-CM | POA: Insufficient documentation

## 2018-05-08 DIAGNOSIS — F17210 Nicotine dependence, cigarettes, uncomplicated: Secondary | ICD-10-CM | POA: Insufficient documentation

## 2018-05-08 MED ORDER — HYDROCHLOROTHIAZIDE 25 MG PO TABS: 25.0000 mg | ORAL_TABLET | Freq: Every day | ORAL | 6 refills | Status: DC

## 2018-05-08 MED ORDER — AMLODIPINE BESYLATE 10 MG PO TABS: 10.0000 mg | ORAL_TABLET | Freq: Every day | ORAL | 6 refills | Status: DC

## 2018-05-08 MED ORDER — LISINOPRIL 40 MG PO TABS: 40.0000 mg | ORAL_TABLET | Freq: Every day | ORAL | 6 refills | Status: DC

## 2018-05-08 NOTE — Progress Notes (Signed)
Subjective:    Patient ID: Jimmy Zimmerman, male    DOB: July 30, 1966, 52 y.o.   MRN: [id number]  HPI 52 year old male who was last seen in the office on October 06, 2017 in follow-up of his hypertension who returns for continued follow-up of chronic medical issues.  Patient reports that he was seen in the emergency department in April secondary to abdominal pain and was diagnosed with pancreatitis.  Patient reports that he has been trying to decrease his alcohol consumption since that time and patient reports no further episodes of epigastric pain.  Patient does continue to drink about two 40 ounce cans of beer nightly.  Patient continues to smoke about half pack a day of cigarettes and patient reports he is currently single.  Patient states that he works for a company that [employer].  Patient states that his surgical history consist of right knee surgery after accident and patient reports that he has had a colonoscopy.  Patient reports a family history of mother with diabetes and recent onset within the past 5 months of seizures.      Patient reports that he needs refills of his blood pressure medications.  Patient reports that he has had some headaches since being out of his blood pressure medication.  Patient states that the headaches are dull, generalized NR about a 3-4 on a 0-to-10 scale and resolve without intervention.  Patient denies any chest pain or palpitations, no shortness of breath or cough.  Patient denies any abdominal pain and no nausea.   Review of Systems  Constitutional: Negative for chills and fever.  Respiratory: Negative for cough and shortness of breath.   Cardiovascular: Negative for chest pain, palpitations and leg swelling.  Gastrointestinal: Negative for abdominal pain and nausea.  Genitourinary: Negative for dysuria and frequency.  Musculoskeletal: Positive for arthralgias (Occasional knee pain). Negative for gait problem.  Neurological: Positive for  headaches. Negative for dizziness.       Objective:   Physical Exam BP (!) 151/80 (BP Location: Left Arm, Cuff Size: Normal)   Pulse (!) 109   Temp 99.1 F (37.3 C) (Oral)   Resp 18   Ht 5\' 7"  (1.702 m)   Wt 153 lb 6.4 oz (69.6 kg)   SpO2 94%   BMI 24.03 kg/m Vital signs and nurse's notes reviewed General-well-nourished, well-developed older male in no acute distress Neck-supple, no lymphadenopathy, no thyromegaly, no carotid bruit Cardiovascular- regular rhythm, patient with mild tachycardia Lungs-clear to auscultation bilaterally Abdomen-soft, nontender possible mild hepatic enlargement Back-no CVA tenderness Extremities-no edema      Assessment & Plan:  1. Essential hypertension Patient's blood pressure is slightly elevated at today's visit.  Patient states that he is out of some of his medications.  Patient provided refills of Norvasc, hydrochlorothiazide and Prinivil.  Patient will have CMP in follow-up of hypertension and use of medications.  Patient is encouraged to remain compliant with daily use of medications. - Comprehensive metabolic panel - amLODipine (NORVASC) 10 MG tablet; Take 1 tablet (10 mg total) by mouth daily.  Dispense: 30 tablet; Refill: 6 - hydrochlorothiazide (HYDRODIURIL) 25 MG tablet; Take 1 tablet (25 mg total) by mouth daily.  Dispense: 30 tablet; Refill: 6 - lisinopril (PRINIVIL,ZESTRIL) 40 MG tablet; Take 1 tablet (40 mg total) by mouth daily.  Dispense: 30 tablet; Refill: 6  2. Elevated liver enzymes Patient with daily use of alcohol review of notes/blood work from his hospitalization for pancreatitis, patient with an elevation in liver enzymes.  Patient will have CMP in follow-up.  Patient is counseled to decrease his alcohol intake with goal of complete cessation as well as avoidance of medications such as acetaminophen/Tylenol which are toxic to the liver - Comprehensive metabolic panel  3. Alcohol dependence with unspecified alcohol-induced  disorder [facility]) Patient with alcohol dependence which was discussed with the patient at today's visit.  Patient will have CMP in follow-up.  Patient is encouraged to decrease alcohol consumption with goal of cessation - Comprehensive metabolic panel  4. Tachycardia Patient with tachycardia and patient will have CBC to look for anemia - CBC with Differential  5. Need for immunization against influenza Patient was offered and agreed to have influenza immunization at today's visit.  Patient was also given educational handout. - Flu Vaccine QUAD 36+ mos IM  An After Visit Summary was printed and given to the patient.  Return in about 4 months (around 09/07/2018) for 4 week NV/BP check; 4 months.

## 2018-05-08 NOTE — Progress Notes (Signed)
Flu shot: yes Pain: o

## 2018-05-09 LAB — COMPREHENSIVE METABOLIC PANEL WITH GFR
ALT: 38 IU/L (ref 0–44)
AST: 47 IU/L — ABNORMAL HIGH (ref 0–40)
Albumin/Globulin Ratio: 1.9 (ref 1.2–2.2)
Albumin: 4.9 g/dL (ref 3.5–5.5)
Alkaline Phosphatase: 61 IU/L (ref 39–117)
BUN/Creatinine Ratio: 7 — ABNORMAL LOW (ref 9–20)
BUN: 7 mg/dL (ref 6–24)
Bilirubin Total: 1.3 mg/dL — ABNORMAL HIGH (ref 0.0–1.2)
CO2: 25 mmol/L (ref 20–29)
Calcium: 10.3 mg/dL — ABNORMAL HIGH (ref 8.7–10.2)
Chloride: 94 mmol/L — ABNORMAL LOW (ref 96–106)
Creatinine, Ser: 1.01 mg/dL (ref 0.76–1.27)
GFR calc Af Amer: 98 mL/min/1.73
GFR calc non Af Amer: 85 mL/min/1.73
Globulin, Total: 2.6 g/dL (ref 1.5–4.5)
Glucose: 95 mg/dL (ref 65–99)
Potassium: 3.8 mmol/L (ref 3.5–5.2)
Sodium: 140 mmol/L (ref 134–144)
Total Protein: 7.5 g/dL (ref 6.0–8.5)

## 2018-05-09 LAB — CBC WITH DIFFERENTIAL/PLATELET
Basophils Absolute: 0 x10E3/uL (ref 0.0–0.2)
Basos: 0 %
EOS (ABSOLUTE): 0 x10E3/uL (ref 0.0–0.4)
Eos: 0 %
Hematocrit: 45.7 % (ref 37.5–51.0)
Hemoglobin: 15.4 g/dL (ref 13.0–17.7)
Immature Grans (Abs): 0 x10E3/uL (ref 0.0–0.1)
Immature Granulocytes: 0 %
Lymphocytes Absolute: 1.1 x10E3/uL (ref 0.7–3.1)
Lymphs: 14 %
MCH: 33.3 pg — ABNORMAL HIGH (ref 26.6–33.0)
MCHC: 33.7 g/dL (ref 31.5–35.7)
MCV: 99 fL — ABNORMAL HIGH (ref 79–97)
Monocytes Absolute: 1 x10E3/uL — ABNORMAL HIGH (ref 0.1–0.9)
Monocytes: 12 %
Neutrophils Absolute: 5.7 x10E3/uL (ref 1.4–7.0)
Neutrophils: 74 %
Platelets: 272 x10E3/uL (ref 150–450)
RBC: 4.62 x10E6/uL (ref 4.14–5.80)
RDW: 15.2 % (ref 12.3–15.4)
WBC: 7.7 x10E3/uL (ref 3.4–10.8)

## 2018-05-17 MED FILL — HYDROCHLOROTHIAZIDE 25 MG T: 25 | 30 days supply | Qty: 30 | Fill #0

## 2018-05-17 MED FILL — LISINOPRIL 40 MG TABLET: 40 | 30 days supply | Qty: 30 | Fill #0

## 2018-05-21 ENCOUNTER — Telehealth: Payer: Self-pay | Admitting: *Deleted

## 2018-05-21 NOTE — Telephone Encounter (Signed)
Patient verified DOB Patient is aware of needing to avoid alcohol/beer and tylenol to reduce the effects on the liver. Patient is also aware of cell size being increases and needing to take OTC B12 and folic acid. No further questions at this time.

## 2018-05-21 NOTE — Telephone Encounter (Signed)
-----   Message from Cain Saupe, MD sent at 05/09/2018  9:17 AM EDT ----- please notify patient that he had a mild increase in his AST which is a liver enzyme, at 47 normal value of 0-40.  Patient needs to decrease use of alcohol/beer and avoid use of Tylenol as these things can injure the liver.  Patient CBC is normal with the exception of an increase in cell size.  Patient may benefit from any over-the-counter vitamin B12 and folate supplement.

## 2018-06-18 MED FILL — HYDROCHLOROTHIAZIDE 25 MG T: 25 | 30 days supply | Qty: 30 | Fill #1

## 2018-06-18 MED FILL — LISINOPRIL 40 MG TABLET: 40 | 30 days supply | Qty: 30 | Fill #1

## 2018-06-18 MED FILL — AMLODIPINE BESYLATE 10 MG T: 10 | 30 days supply | Qty: 30 | Fill #1

## 2018-07-26 MED FILL — HYDROCHLOROTHIAZIDE 25 MG T: 25 | 30 days supply | Qty: 30 | Fill #2

## 2018-07-26 MED FILL — LISINOPRIL 40 MG TABLET: 40 | 30 days supply | Qty: 30 | Fill #2

## 2018-07-26 MED FILL — AMLODIPINE BESYLATE 10 MG T: 10 | 30 days supply | Qty: 30 | Fill #2

## 2018-09-03 MED FILL — AMLODIPINE BESYLATE 10 MG T: 10 | 30 days supply | Qty: 30 | Fill #3

## 2018-09-03 MED FILL — LISINOPRIL 40 MG TABLET: 40 | 30 days supply | Qty: 30 | Fill #3

## 2018-09-03 MED FILL — HYDROCHLOROTHIAZIDE 25 MG T: 25 | 30 days supply | Qty: 30 | Fill #3

## 2018-10-16 MED FILL — LISINOPRIL 40 MG TABLET: 40 | 30 days supply | Qty: 30 | Fill #4

## 2018-10-16 MED FILL — AMLODIPINE BESYLATE 10 MG T: 10 | 30 days supply | Qty: 30 | Fill #4

## 2018-10-16 MED FILL — HYDROCHLOROTHIAZIDE 25 MG T: 25 | 30 days supply | Qty: 30 | Fill #4

## 2018-11-15 MED FILL — HYDROCHLOROTHIAZIDE 25 MG T: 25 | 30 days supply | Qty: 30 | Fill #5

## 2018-11-15 MED FILL — LISINOPRIL 40 MG TABLET: 40 | 30 days supply | Qty: 30 | Fill #5

## 2018-11-15 MED FILL — AMLODIPINE BESYLATE 10 MG T: 10 | 30 days supply | Qty: 30 | Fill #5

## 2018-12-28 MED FILL — ?AMLODIPINE BESYLATE 10 MG: 10 | 30 days supply | Qty: 30 | Fill #6

## 2019-03-13 ENCOUNTER — Other Ambulatory Visit: Payer: Self-pay | Admitting: Family Medicine

## 2019-03-13 DIAGNOSIS — I10 Essential (primary) hypertension: Secondary | ICD-10-CM

## 2019-03-13 MED FILL — LISINOPRIL 40 MG TABLET: 40 | 30 days supply | Qty: 30 | Fill #6

## 2019-03-13 MED FILL — HYDROCHLOROTHIAZIDE 25 MG T: 25 | 30 days supply | Qty: 30 | Fill #6

## 2019-03-27 ENCOUNTER — Other Ambulatory Visit: Payer: Self-pay | Admitting: Family Medicine

## 2019-03-27 DIAGNOSIS — I10 Essential (primary) hypertension: Secondary | ICD-10-CM

## 2019-03-27 MED FILL — LISINOPRIL 40 MG TABLET: 40 | 30 days supply | Qty: 30 | Fill #6

## 2019-03-27 MED FILL — ?HYDROCHLOROTHIAZIDE 25MG T: 25 | 30 days supply | Qty: 30 | Fill #6

## 2019-05-06 ENCOUNTER — Other Ambulatory Visit: Payer: Self-pay | Admitting: Family Medicine

## 2019-05-06 DIAGNOSIS — I10 Essential (primary) hypertension: Secondary | ICD-10-CM

## 2019-05-24 ENCOUNTER — Ambulatory Visit: Payer: Self-pay | Attending: Family Medicine | Admitting: Family Medicine

## 2019-05-24 ENCOUNTER — Encounter: Payer: Self-pay | Admitting: Family Medicine

## 2019-05-24 ENCOUNTER — Other Ambulatory Visit: Payer: Self-pay

## 2019-05-24 VITALS — BP 174/120 | HR 86 | Ht 67.0 in | Wt 149.0 lb

## 2019-05-24 DIAGNOSIS — I160 Hypertensive urgency: Secondary | ICD-10-CM

## 2019-05-24 DIAGNOSIS — I10 Essential (primary) hypertension: Secondary | ICD-10-CM

## 2019-05-24 MED ORDER — LISINOPRIL 40 MG PO TABS: 40.0000 mg | ORAL_TABLET | Freq: Every day | ORAL | 6 refills | Status: DC

## 2019-05-24 MED ORDER — AMLODIPINE BESYLATE 10 MG PO TABS: 10.0000 mg | ORAL_TABLET | Freq: Every day | ORAL | 6 refills | Status: DC

## 2019-05-24 MED ORDER — HYDROCHLOROTHIAZIDE 25 MG PO TABS: 25.0000 mg | ORAL_TABLET | Freq: Every day | ORAL | 6 refills | Status: DC

## 2019-05-24 MED FILL — LISINOPRIL 40 MG TABLET: 40 | 30 days supply | Qty: 30 | Fill #0

## 2019-05-24 MED FILL — ?HYDROCHLOROTHIAZIDE 25MG T: 25 | 30 days supply | Qty: 30 | Fill #0

## 2019-05-24 MED FILL — ?AMLODIPINE BESYLATE 10 MG: 10 | 30 days supply | Qty: 30 | Fill #0

## 2019-05-24 NOTE — Progress Notes (Signed)
Established Patient Office Visit  Subjective:  Patient ID: Jimmy Zimmerman, male    DOB: 1965/09/20  Age: 53 y.o. MRN: [id number]  CC: No chief complaint on file.   HPI Jimmy Zimmerman presents for follow-up of hypertension and out of medications for the past 2 months.  He has had an occasional dull, top of head or whole head headache that he reports has been mild and occurs randomly since being out of blood pressure medicine.  He has had no dizziness and he denies any focal numbness or weakness.  Patient has history of pancreatitis but he denies any current issues with abdominal pain or nausea.  He has had no chest pain or palpitations, no fever or chills, no shortness of breath or cough.  He has some mild fatigue.  Past Medical History:  Diagnosis Date   Hypertension    Pancreatitis     Past Surgical History:  Procedure Laterality Date   HEMORRHOID SURGERY     right knee surgery       Family History  Problem Relation Age of Onset   Hypertension Mother    Diabetes Mother    Stroke Maternal Grandmother     Social History   Socioeconomic History   Marital status: Single    Spouse name: Not on file   Number of children: Not on file   Years of education: Not on file   Highest education level: Not on file  Occupational History   Not on file  Social Needs   Financial resource strain: Not on file   Food insecurity    Worry: Not on file    Inability: Not on file   Transportation needs    Medical: Not on file    Non-medical: Not on file  Tobacco Use   Smoking status: Current Every Day Smoker    Packs/day: 0.50    Years: 30.00    Pack years: 15.00    Types: Cigarettes   Smokeless tobacco: Never Used  Substance and Sexual Activity   Alcohol use: Yes    Alcohol/week: 6.0 standard drinks    Types: 6 Cans of beer per week    Comment: everyday    Drug use: No   Sexual activity: Not on file  Lifestyle   Physical activity    Days per week: Not on  file    Minutes per session: Not on file   Stress: Not on file  Relationships   Social connections    Talks on phone: Not on file    Gets together: Not on file    Attends religious service: Not on file    Active member of club or organization: Not on file    Attends meetings of clubs or organizations: Not on file    Relationship status: Not on file   Intimate partner violence    Fear of current or ex partner: Not on file    Emotionally abused: Not on file    Physically abused: Not on file    Forced sexual activity: Not on file  Other Topics Concern   Not on file  Social History Narrative   Not on file    Outpatient Medications Prior to Visit  Medication Sig Dispense Refill   amLODipine (NORVASC) 10 MG tablet Take 1 tablet (10 mg total) by mouth daily. 30 tablet 6   hydrochlorothiazide (HYDRODIURIL) 25 MG tablet Take 1 tablet (25 mg total) by mouth daily. 30 tablet 6   lisinopril (PRINIVIL,ZESTRIL) 40 MG  tablet Take 1 tablet (40 mg total) by mouth daily. 30 tablet 6   nicotine (NICODERM CQ) 21 mg/24hr patch Place 1 patch (21 mg total) onto the skin daily. (Patient not taking: Reported on 05/08/2018) 28 patch 1   traMADol (ULTRAM) 50 MG tablet Take 1 tablet (50 mg total) by mouth every 6 (six) hours as needed for severe pain. (Patient not taking: Reported on 05/08/2018) 15 tablet 0   No facility-administered medications prior to visit.     No Known Allergies  ROS Review of Systems  Constitutional: Positive for fatigue. Negative for chills and fever.  HENT: Negative for sore throat and trouble swallowing.   Eyes: Negative for photophobia and visual disturbance.  Respiratory: Negative for cough and shortness of breath.   Cardiovascular: Negative for chest pain, palpitations and leg swelling.  Gastrointestinal: Negative for abdominal pain, constipation, diarrhea and nausea.  Endocrine: Negative for cold intolerance, heat intolerance, polydipsia, polyphagia and polyuria.    Genitourinary: Negative for dysuria and frequency.  Musculoskeletal: Negative for arthralgias and gait problem.  Neurological: Positive for headaches. Negative for dizziness and weakness.  Hematological: Negative for adenopathy. Does not bruise/bleed easily.      Objective:    Physical Exam  Constitutional: He is oriented to person, place, and time. He appears well-developed and well-nourished.  Neck: Normal range of motion. Neck supple. No JVD present.  Cardiovascular: Normal rate and regular rhythm.  Pulmonary/Chest: Effort normal and breath sounds normal.  Abdominal: Soft. There is no abdominal tenderness. There is no rebound and no guarding.  Musculoskeletal:        General: No tenderness or edema.     Comments: No CVA tenderness  Lymphadenopathy:    He has no cervical adenopathy.  Neurological: He is alert and oriented to person, place, and time.  Psychiatric: He has a normal mood and affect. His behavior is normal.  Nursing note and vitals reviewed.   BP (!) 174/120 (BP Location: Left Arm, Patient Position: Sitting, Cuff Size: Normal)    Pulse 86    Ht 5\' 7"  (1.702 m)    Wt 149 lb (67.6 kg)    SpO2 97%    BMI 23.34 kg/m  Wt Readings from Last 3 Encounters:  05/24/19 149 lb (67.6 kg)  05/08/18 153 lb 6.4 oz (69.6 kg)  10/06/17 162 lb (73.5 kg)     Health Maintenance Due  Topic Date Due   INFLUENZA VACCINE  03/23/2019   Patient was offered but declined influenza immunization   Lab Results  Component Value Date   TSH 1.194 09/22/2014   Lab Results  Component Value Date   WBC 7.7 05/08/2018   HGB 15.4 05/08/2018   HCT 45.7 05/08/2018   MCV 99 (H) 05/08/2018   PLT 272 05/08/2018   Lab Results  Component Value Date   NA 140 05/08/2018   K 3.8 05/08/2018   CO2 25 05/08/2018   GLUCOSE 95 05/08/2018   BUN 7 05/08/2018   CREATININE 1.01 05/08/2018   BILITOT 1.3 (H) 05/08/2018   ALKPHOS 61 05/08/2018   AST 47 (H) 05/08/2018   ALT 38 05/08/2018   PROT 7.5  05/08/2018   ALBUMIN 4.9 05/08/2018   CALCIUM 10.3 (H) 05/08/2018   ANIONGAP 13 12/02/2017   Lab Results  Component Value Date   CHOL 194 05/18/2017   Lab Results  Component Value Date   HDL 126 05/18/2017   Lab Results  Component Value Date   LDLCALC 49 05/18/2017   Lab  Results  Component Value Date   TRIG 94 05/18/2017   Lab Results  Component Value Date   CHOLHDL 1.5 05/18/2017   Lab Results  Component Value Date   HGBA1C 5.4 05/18/2017      Assessment & Plan:  1. Essential hypertension; 2.  Hypertensive urgency Patient reports he has been out of medication for the past 2 months due to the cost.  Patient was given refill of his medications at today's visit and the cost of the medications were covered.  Patient was provided with the medications and allowed to take today's dose of medication here in the office.  Patient was to have blood pressure rechecked here in the office but I am not sure if this was done at today's visit.  He is encouraged to follow-up in the next 1 to 2 weeks for blood pressure recheck as a nurse visit to make sure that his blood pressure has returned to a normal range.  Patient was reminded in the future to call or come into the office when he is almost out of medications so that resources can be found to make sure that he does not go without his medications.  Handout provided on hypertension as part of today's after visit summary. - amLODipine (NORVASC) 10 MG tablet; Take 1 tablet (10 mg total) by mouth daily.  Dispense: 30 tablet; Refill: 6 - hydrochlorothiazide (HYDRODIURIL) 25 MG tablet; Take 1 tablet (25 mg total) by mouth daily.  Dispense: 30 tablet; Refill: 6 - lisinopril (ZESTRIL) 40 MG tablet; Take 1 tablet (40 mg total) by mouth daily.  Dispense: 30 tablet; Refill: 6   An After Visit Summary was printed and given to the patient.  Follow-up: Return in about 4 months (around 09/24/2019) for HTN- NV BP check in 1-2 weeks .   Cain Saupeammie Freddi Forster, MD

## 2019-05-24 NOTE — Patient Instructions (Signed)

## 2019-06-26 MED FILL — ?AMLODIPINE BESYLATE 10 MG: 10 | 30 days supply | Qty: 30 | Fill #1

## 2019-06-26 MED FILL — ?HYDROCHLOROTHIAZIDE 25MG T: 25 | 30 days supply | Qty: 30 | Fill #1

## 2019-06-26 MED FILL — LISINOPRIL 40 MG TABLET: 40 | 30 days supply | Qty: 30 | Fill #1

## 2019-07-29 MED FILL — LISINOPRIL 40 MG TABLET: 40 | 30 days supply | Qty: 30 | Fill #2

## 2019-07-29 MED FILL — ?HYDROCHLOROTHIAZIDE 25MG T: 25 | 30 days supply | Qty: 30 | Fill #2

## 2019-07-29 MED FILL — ?AMLODIPINE BESYLATE 10 MG: 10 | 30 days supply | Qty: 30 | Fill #2

## 2019-09-02 MED FILL — LISINOPRIL 40 MG TABLET: 40 | 30 days supply | Qty: 30 | Fill #3

## 2019-09-02 MED FILL — ?HYDROCHLOROTHIAZIDE 25MG T: 25 | 30 days supply | Qty: 30 | Fill #3

## 2019-09-02 MED FILL — ?AMLODIPINE BESYLATE 10 MG: 10 | 30 days supply | Qty: 30 | Fill #3

## 2019-10-07 MED FILL — ?AMLODIPINE BESYL 10MG TABL: 10 | 30 days supply | Qty: 30 | Fill #4

## 2019-10-07 MED FILL — LISINOPRIL 40 MG TABLET: 40 | 30 days supply | Qty: 30 | Fill #4

## 2019-10-07 MED FILL — ?HYDROCHLOROTHIAZIDE 25MG T: 25 | 30 days supply | Qty: 30 | Fill #4

## 2019-11-05 MED FILL — LISINOPRIL 40 MG TABLET: 40 | 30 days supply | Qty: 30 | Fill #5

## 2019-11-05 MED FILL — HYDROCHLOROTHIAZIDE 25 MG T: 25 | 30 days supply | Qty: 30 | Fill #5

## 2019-11-05 MED FILL — AMLODIPINE BESYLATE 10 MG T: 10 | 30 days supply | Qty: 30 | Fill #5

## 2019-12-10 MED FILL — HYDROCHLOROTHIAZIDE 25 MG T: 25 | 30 days supply | Qty: 30 | Fill #6

## 2019-12-10 MED FILL — AMLODIPINE BESYLATE 10 MG T: 10 | 30 days supply | Qty: 30 | Fill #6

## 2019-12-10 MED FILL — LISINOPRIL 40 MG TABLET: 40 | 30 days supply | Qty: 30 | Fill #6

## 2020-01-14 ENCOUNTER — Other Ambulatory Visit: Payer: Self-pay | Admitting: Family Medicine

## 2020-01-14 DIAGNOSIS — I10 Essential (primary) hypertension: Secondary | ICD-10-CM

## 2020-01-22 NOTE — Progress Notes (Signed)
Patient did not show for appointment.   

## 2020-01-23 ENCOUNTER — Ambulatory Visit (HOSPITAL_BASED_OUTPATIENT_CLINIC_OR_DEPARTMENT_OTHER): Payer: Self-pay | Admitting: Family

## 2020-01-23 DIAGNOSIS — Z5329 Procedure and treatment not carried out because of patient's decision for other reasons: Secondary | ICD-10-CM

## 2020-02-10 NOTE — Progress Notes (Signed)
Patient ID: Jimmy Zimmerman, male    DOB: 04/22/1966  MRN: [id number]  CC: Hypertension Follow-Up  Subjective: Jimmy Zimmerman is a 54 y.o. male with history of essential hypertension, acute pancreatitis, nicotine dependence, alcohol abuse, hypokalemia, and elevated liver enzymes who presents for hypertension follow-up.  1. HYPERTENSION FOLLOW-UP: Currently taking: see medication list  Med Adherence: _0  Yes    _1  No Medication side effects: _2  Yes    _3  No Adherence with salt restriction: _4  Yes    _5  No Exercise: Yes _6  No _7   Home Monitoring?: _8  Yes    _9  No Monitoring Frequency: _10  Yes    _11  No Home BP results range: _12  Yes    _13  No Smoking _14  Yes, 1 pack lasts about 1.5 days SOB? _15  Yes    _16  No Chest Pain?: _17  Yes    _18  No Leg swelling?: _19  Yes    _20  No Headaches?: _21  Yes    _22  No Dizziness? _23  Yes    _24  No Comments: Last visit 05/24/2019 with Dr. Chapman Fitch. During that encounter continued on Amlodipine, Hydrochlorothiazide, and Lisinopril.  Patient Active Problem List   Diagnosis Date Noted  . Elevated liver enzymes   . Alcoholic ketoacidosis   . Abnormal [place] (ultrasound) of abdomen 04/19/2015  . Acute pancreatitis 04/18/2015  . Alcohol abuse 04/18/2015  . Hypokalemia 04/18/2015  . Essential hypertension 05/27/2014  . Nicotine dependence 05/27/2014  . Family history of diabetes mellitus (DM) 05/27/2014     Current Outpatient Medications on File Prior to Visit  Medication Sig Dispense Refill  . amLODipine (NORVASC) 10 MG tablet Take 1 tablet (10 mg total) by mouth daily. 30 tablet 6  . hydrochlorothiazide (HYDRODIURIL) 25 MG tablet Take 1 tablet (25 mg total) by mouth daily. 30 tablet 6  . lisinopril (ZESTRIL) 40 MG tablet Take 1 tablet (40 mg total) by mouth daily. 30 tablet 6  . nicotine (NICODERM CQ) 21 mg/24hr patch Place 1 patch (21 mg total) onto the skin daily. (Patient not taking: Reported on 05/08/2018) 28 patch 1  . traMADol (ULTRAM) 50 MG tablet Take 1  tablet (50 mg total) by mouth every 6 (six) hours as needed for severe pain. (Patient not taking: Reported on 05/08/2018) 15 tablet 0   No current facility-administered medications on file prior to visit.    No Known Allergies  Social History   Socioeconomic History  . Marital status: Single    Spouse name: Not on file  . Number of children: Not on file  . Years of education: Not on file  . Highest education level: Not on file  Occupational History  . Not on file  Tobacco Use  . Smoking status: Current Every Day Smoker    Packs/day: 0.50    Years: 30.00    Pack years: 15.00    Types: Cigarettes  . Smokeless tobacco: Never Used  Vaping Use  . Vaping Use: Never used  Substance and Sexual Activity  . Alcohol use: Yes    Alcohol/week: 6.0 standard drinks    Types: 6 Cans of beer per week    Comment: everyday   . Drug use: No  . Sexual activity: Not on file  Other Topics Concern  . Not on file  Social History Narrative  . Not on file   Social Determinants of Health   Financial Resource Strain:   . Difficulty of Paying Living Expenses:   Food Insecurity:   . Worried About Charity fundraiser  in the Last Year:   . [place] in the Last Year:   Transportation Needs:   . Film/video editor (Medical):   Marland Kitchen Lack of Transportation (Non-Medical):   Physical Activity:   . Days of Exercise per Week:   . Minutes of Exercise per Session:   Stress:   . Feeling of Stress :   Social Connections:   . Frequency of Communication with Friends and Family:   . Frequency of Social Gatherings with Friends and Family:   . Attends Religious Services:   . Active Member of Clubs or Organizations:   . Attends Archivist Meetings:   Marland Kitchen Marital Status:   Intimate Partner Violence:   . Fear of Current or Ex-Partner:   . Emotionally Abused:   Marland Kitchen Physically Abused:   . Sexually Abused:     Family History  Problem Relation Age of Onset  . Hypertension Mother   .  Diabetes Mother   . Stroke Maternal Grandmother     Past Surgical History:  Procedure Laterality Date  . HEMORRHOID SURGERY    . right knee surgery       ROS: Review of Systems Negative except as stated above  PHYSICAL EXAM: Vitals with BMI 02/13/2020 05/24/2019 05/08/2018  Height - _0  -  Weight 146 lbs 149 lbs -  BMI - 29.79 -  Systolic 892 119 417  Diastolic 83 408 80  Pulse 103 86 109  SpO2- 100%, room air Temperature- 96.53F, oral  Physical Exam General appearance - alert, well appearing, and in no distress and oriented to person, place, and time Mental status - alert, oriented to person, place, and time, normal mood, behavior, speech, dress, motor activity, and thought processes Neck - supple, no significant adenopathy Lymphatics - no palpable lymphadenopathy, no hepatosplenomegaly Chest - clear to auscultation, no wheezes, rales or rhonchi, symmetric air entry, no tachypnea, retractions or cyanosis Heart - normal rate, regular rhythm, normal S1, S2, no murmurs, rubs, clicks or gallops Neurological - alert, oriented, normal speech, no focal findings or movement disorder noted, neck supple without rigidity, cranial nerves II through XII intact, DTR's normal and symmetric, motor and sensory grossly normal bilaterally, normal muscle tone, no tremors, strength 5/5, Romberg sign negative, normal gait and station Extremities - peripheral pulses normal, no pedal edema, no clubbing or cyanosis  ASSESSMENT AND PLAN: 1. Essential hypertension: -Today blood pressure at goal. Pulse is elevated at 103. Patient asymptomatic without chest pain, palpitations, and shortness of breath. -Continue Amlodipine, Hydrochlorothiazide, and Lisinopril as prescribed for hypertension.  -Counseled on blood pressure goal of less than 130/80, low-sodium, DASH diet, medication compliance, 150 minutes of moderate intensity exercise per week as tolerated. Discussed medication compliance, adverse  effects. -CMP to evaluate kidney function, liver function, and electrolyte balance. -CBC to evaluate blood count and anemia. -Lipid panel to evaluate cholesterol levels. -Follow-up with primary physician in 3 months or sooner if needed. - CMP14+EGFR - CBC With Differential - Lipid panel - amLODipine (NORVASC) 10 MG tablet; Take 1 tablet (10 mg total) by mouth daily.  Dispense: 30 tablet; Refill: 3 - hydrochlorothiazide (HYDRODIURIL) 25 MG tablet; Take 1 tablet (25 mg total) by mouth daily.  Dispense: 30 tablet; Refill: 3 - lisinopril (ZESTRIL) 40 MG tablet; Take 1 tablet (40 mg total) by mouth daily.  Dispense: 30 tablet; Refill: 3  Patient was given the opportunity to ask questions.  Patient verbalized understanding of the plan and was able to repeat key elements  of the plan. Patient was given clear instructions to go to Emergency Department or return to medical center if symptoms don't improve, worsen, or new problems develop.The patient verbalized understanding.  Camillia Herter, NP

## 2020-02-13 ENCOUNTER — Other Ambulatory Visit: Payer: Self-pay | Admitting: Family

## 2020-02-13 ENCOUNTER — Other Ambulatory Visit: Payer: Self-pay

## 2020-02-13 ENCOUNTER — Ambulatory Visit: Payer: Self-pay | Attending: Family | Admitting: Family

## 2020-02-13 ENCOUNTER — Encounter: Payer: Self-pay | Admitting: Family

## 2020-02-13 VITALS — BP 133/83 | HR 103 | Temp 96.4°F | Resp 16 | Wt 146.0 lb

## 2020-02-13 DIAGNOSIS — I10 Essential (primary) hypertension: Secondary | ICD-10-CM

## 2020-02-13 MED ORDER — HYDROCHLOROTHIAZIDE 25 MG PO TABS: 25.0000 mg | ORAL_TABLET | Freq: Every day | ORAL | 3 refills | Status: DC

## 2020-02-13 MED ORDER — LISINOPRIL 40 MG PO TABS: 40.0000 mg | ORAL_TABLET | Freq: Every day | ORAL | 3 refills | Status: DC

## 2020-02-13 MED ORDER — AMLODIPINE BESYLATE 10 MG PO TABS: 10.0000 mg | ORAL_TABLET | Freq: Every day | ORAL | 3 refills | Status: DC

## 2020-02-13 NOTE — Patient Instructions (Addendum)
Continue Amlodipine, Hydrochlorothiazide, and Lisinopril as prescribed. Lab today. Follow-up with primary physician in 3 months or sooner if needed. Hypertension, Adult Hypertension is another name for high blood pressure. High blood pressure forces your heart to work harder to pump blood. This can cause problems over time. There are two numbers in a blood pressure reading. There is a top number (systolic) over a bottom number (diastolic). It is best to have a blood pressure that is below 120/80. Healthy choices can help lower your blood pressure, or you may need medicine to help lower it. What are the causes? The cause of this condition is not known. Some conditions may be related to high blood pressure. What increases the risk?  Smoking.  Having type 2 diabetes mellitus, high cholesterol, or both.  Not getting enough exercise or physical activity.  Being overweight.  Having too much fat, sugar, calories, or salt (sodium) in your diet.  Drinking too much alcohol.  Having long-term (chronic) kidney disease.  Having a family history of high blood pressure.  Age. Risk increases with age.  Race. You may be at higher risk if you are African American.  Gender. Men are at higher risk than women before age 79. After age 13, women are at higher risk than men.  Having obstructive sleep apnea.  Stress. What are the signs or symptoms?  High blood pressure may not cause symptoms. Very high blood pressure (hypertensive crisis) may cause: ? Headache. ? Feelings of worry or nervousness (anxiety). ? Shortness of breath. ? Nosebleed. ? A feeling of being sick to your stomach (nausea). ? Throwing up (vomiting). ? Changes in how you see. ? Very bad chest pain. ? Seizures. How is this treated?  This condition is treated by making healthy lifestyle changes, such as: ? Eating healthy foods. ? Exercising more. ? Drinking less alcohol.  Your health care provider may prescribe medicine if  lifestyle changes are not enough to get your blood pressure under control, and if: ? Your top number is above 130. ? Your bottom number is above 80.  Your personal target blood pressure may vary. Follow these instructions at home: Eating and drinking   If told, follow the DASH eating plan. To follow this plan: ? Fill one half of your plate at each meal with fruits and vegetables. ? Fill one fourth of your plate at each meal with whole grains. Whole grains include whole-wheat pasta, brown rice, and whole-grain bread. ? Eat or drink low-fat dairy products, such as skim milk or low-fat yogurt. ? Fill one fourth of your plate at each meal with low-fat (lean) proteins. Low-fat proteins include fish, chicken without skin, eggs, beans, and tofu. ? Avoid fatty meat, cured and processed meat, or chicken with skin. ? Avoid pre-made or processed food.  Eat less than 1,500 mg of salt each day.  Do not drink alcohol if: ? Your doctor tells you not to drink. ? You are pregnant, may be pregnant, or are planning to become pregnant.  If you drink alcohol: ? Limit how much you use to:  0-1 drink a day for women.  0-2 drinks a day for men. ? Be aware of how much alcohol is in your drink. In the [place] one drink equals one 12 oz bottle of beer (355 mL), one 5 oz glass of wine (148 mL), or one 1 oz glass of hard liquor (44 mL). Lifestyle   Work with your doctor to stay at a healthy weight or to lose  weight. Ask your doctor what the best weight is for you.  Get at least 30 minutes of exercise most days of the week. This may include walking, swimming, or biking.  Get at least 30 minutes of exercise that strengthens your muscles (resistance exercise) at least 3 days a week. This may include lifting weights or doing Pilates.  Do not use any products that contain nicotine or tobacco, such as cigarettes, e-cigarettes, and chewing tobacco. If you need help quitting, ask your doctor.  Check your blood  pressure at home as told by your doctor.  Keep all follow-up visits as told by your doctor. This is important. Medicines  Take over-the-counter and prescription medicines only as told by your doctor. Follow directions carefully.  Do not skip doses of blood pressure medicine. The medicine does not work as well if you skip doses. Skipping doses also puts you at risk for problems.  Ask your doctor about side effects or reactions to medicines that you should watch for. Contact a doctor if you:  Think you are having a reaction to the medicine you are taking.  Have headaches that keep coming back (recurring).  Feel dizzy.  Have swelling in your ankles.  Have trouble with your vision. Get help right away if you:  Get a very bad headache.  Start to feel mixed up (confused).  Feel weak or numb.  Feel faint.  Have very bad pain in your: ? Chest. ? Belly (abdomen).  Throw up more than once.  Have trouble breathing. Summary  Hypertension is another name for high blood pressure.  High blood pressure forces your heart to work harder to pump blood.  For most people, a normal blood pressure is less than 120/80.  Making healthy choices can help lower blood pressure. If your blood pressure does not get lower with healthy choices, you may need to take medicine. This information is not intended to replace advice given to you by your health care provider. Make sure you discuss any questions you have with your health care provider. Document Revised: 04/18/2018 Document Reviewed: 04/18/2018 Elsevier Patient Education  2020 [employer].

## 2020-02-14 LAB — CMP14+EGFR
ALT: 24 IU/L (ref 0–44)
AST: 39 IU/L (ref 0–40)
Albumin/Globulin Ratio: 1.8 (ref 1.2–2.2)
Albumin: 4.6 g/dL (ref 3.8–4.9)
Alkaline Phosphatase: 69 IU/L (ref 48–121)
BUN/Creatinine Ratio: 7 — ABNORMAL LOW (ref 9–20)
BUN: 6 mg/dL (ref 6–24)
Bilirubin Total: 1 mg/dL (ref 0.0–1.2)
CO2: 21 mmol/L (ref 20–29)
Calcium: 9.6 mg/dL (ref 8.7–10.2)
Chloride: 99 mmol/L (ref 96–106)
Creatinine, Ser: 0.82 mg/dL (ref 0.76–1.27)
GFR calc Af Amer: 116 mL/min/{1.73_m2} (ref >59)
GFR calc non Af Amer: 100 mL/min/{1.73_m2} (ref >59)
Globulin, Total: 2.5 g/dL (ref 1.5–4.5)
Glucose: 59 mg/dL — ABNORMAL LOW (ref 65–99)
Potassium: 4.5 mmol/L (ref 3.5–5.2)
Sodium: 137 mmol/L (ref 134–144)
Total Protein: 7.1 g/dL (ref 6.0–8.5)

## 2020-02-14 LAB — CBC WITH DIFFERENTIAL
Basophils Absolute: 0 10*3/uL (ref 0.0–0.2)
Basos: 0 %
EOS (ABSOLUTE): 0 10*3/uL (ref 0.0–0.4)
Eos: 0 %
Hematocrit: 43.4 % (ref 37.5–51.0)
Hemoglobin: 15.2 g/dL (ref 13.0–17.7)
Immature Grans (Abs): 0 10*3/uL (ref 0.0–0.1)
Immature Granulocytes: 0 %
Lymphocytes Absolute: 1.2 10*3/uL (ref 0.7–3.1)
Lymphs: 19 %
MCH: 32.9 pg (ref 26.6–33.0)
MCHC: 35 g/dL (ref 31.5–35.7)
MCV: 94 fL (ref 79–97)
Monocytes Absolute: 0.8 10*3/uL (ref 0.1–0.9)
Monocytes: 13 %
Neutrophils Absolute: 4.2 10*3/uL (ref 1.4–7.0)
Neutrophils: 68 %
RBC: 4.62 x10E6/uL (ref 4.14–5.80)
RDW: 13.8 % (ref 11.6–15.4)
WBC: 6.2 10*3/uL (ref 3.4–10.8)

## 2020-02-14 LAB — LIPID PANEL
Chol/HDL Ratio: 1.6 ratio (ref 0.0–5.0)
Cholesterol, Total: 201 mg/dL — ABNORMAL HIGH (ref 100–199)
HDL: 129 mg/dL (ref >39)
LDL Chol Calc (NIH): 57 mg/dL (ref 0–99)
Triglycerides: 90 mg/dL (ref 0–149)
VLDL Cholesterol Cal: 15 mg/dL (ref 5–40)

## 2020-02-14 MED FILL — ?AMLODIPINE BESYL 10MG TABL: 10 | 30 days supply | Qty: 30 | Fill #0

## 2020-02-14 MED FILL — HYDROCHLOROTHIAZIDE 25 MG T: 25 | 30 days supply | Qty: 30 | Fill #0

## 2020-02-14 MED FILL — LISINOPRIL 40 MG TABLET: 40 | 30 days supply | Qty: 30 | Fill #0

## 2020-02-14 NOTE — Progress Notes (Signed)
Please call patient with update.   BUN/Creatinine ratio which measures kidney function is lower than expected but the same since 1 year ago. Continued management of high blood pressure may help with this.  No anemia.   Cholesterol slightly higher than expected. Recommendations to make lifestyle changes. Improvement in one's diet and daily exercise as tolerated may bring this number down.   Remember - more fruits and vegetables, more fish, and limit red meat and dairy products. More soy, nuts, beans, barley, lentils, oats and plant sterol ester enriched margarine instead of butter. I also encourage eliminating sugar and processed food. Plans to recheck your cholesterol in 3-6 months.

## 2020-02-26 ENCOUNTER — Telehealth: Payer: Self-pay

## 2020-02-26 NOTE — Telephone Encounter (Signed)
Contacted pt to go over lab results pt didn't answer and was unable to lvm  

## 2020-03-23 ENCOUNTER — Other Ambulatory Visit: Payer: Self-pay | Admitting: Family Medicine

## 2020-03-23 DIAGNOSIS — I10 Essential (primary) hypertension: Secondary | ICD-10-CM

## 2020-03-23 NOTE — Telephone Encounter (Signed)
lisinopril (ZESTRIL) 40 MG tablet hydrochlorothiazide (HYDRODIURIL) 25 MG tablet  amLODipine (NORVASC) 10 MG tablet    Patient is requesting refill.   Pharmacy:  [facility] - [place], [place] - [address] Phone:  [phone]  Fax:  [phone]

## 2020-03-25 MED FILL — LISINOPRIL 40 MG TABLET: 40 | 30 days supply | Qty: 30 | Fill #1

## 2020-03-25 MED FILL — AMLODIPINE BESYLATE 10 MG T: 10 | 30 days supply | Qty: 30 | Fill #1

## 2020-03-25 MED FILL — HYDROCHLOROTHIAZIDE 25 MG T: 25 | 30 days supply | Qty: 30 | Fill #1

## 2020-05-20 MED FILL — LISINOPRIL 40 MG TABLET: 40 | 30 days supply | Qty: 30 | Fill #2

## 2020-05-20 MED FILL — HYDROCHLOROTHIAZIDE 25 MG T: 25 | 30 days supply | Qty: 30 | Fill #2

## 2020-05-20 MED FILL — AMLODIPINE BESYLATE 10 MG T: 10 | 30 days supply | Qty: 30 | Fill #2

## 2020-06-24 MED FILL — LISINOPRIL 40 MG TABLET: 40 | 30 days supply | Qty: 30 | Fill #3

## 2020-06-24 MED FILL — AMLODIPINE BESYLATE 10 MG T: 10 | 30 days supply | Qty: 30 | Fill #3

## 2020-06-24 MED FILL — HYDROCHLOROTHIAZIDE 25 MG T: 25 | 30 days supply | Qty: 30 | Fill #3

## 2020-08-03 ENCOUNTER — Ambulatory Visit: Payer: Self-pay | Admitting: *Deleted

## 2020-08-03 ENCOUNTER — Other Ambulatory Visit: Payer: Self-pay | Admitting: Family Medicine

## 2020-08-03 DIAGNOSIS — I10 Essential (primary) hypertension: Secondary | ICD-10-CM

## 2020-08-03 MED ORDER — HYDROCHLOROTHIAZIDE 25 MG PO TABS: 25.0000 mg | ORAL_TABLET | Freq: Every day | ORAL | 3 refills | Status: DC

## 2020-08-03 MED ORDER — LISINOPRIL 40 MG PO TABS: 40.0000 mg | ORAL_TABLET | Freq: Every day | ORAL | 3 refills | Status: DC

## 2020-08-03 MED ORDER — AMLODIPINE BESYLATE 10 MG PO TABS: 10.0000 mg | ORAL_TABLET | Freq: Every day | ORAL | 3 refills | Status: DC

## 2020-08-03 MED FILL — HYDROCHLOROTHIAZIDE 25 MG T: 25 | 30 days supply | Qty: 30 | Fill #0

## 2020-08-03 MED FILL — LISINOPRIL 40 MG TABS: 40 | 30 days supply | Qty: 30 | Fill #0

## 2020-08-03 MED FILL — AMLODIPINE BESYLATE 10 MG T: 10 | 30 days supply | Qty: 30 | Fill #0

## 2020-08-03 NOTE — Telephone Encounter (Signed)
Pt called in c/o coughing a lot and passing out when he eats hot food, hot sauce on food.   He smokes and "I drink a lot of beer".  See notes below.  Denies any shortness of breath or chest pain.   He was coughing while on the phone with me.  It sounded dry.  The protocol is to be seen within 3 days.  I forwarded this note to [facility] for scheduling.      Reason for Disposition . Substance use (drug use) or unhealthy alcohol use, known or suspected    I drink a lot of beer.  Answer Assessment - Initial Assessment Questions 1. DESCRIPTION: "Describe your dizziness."     I eat something hot I cough and then pass out for 2 minutes.  Last week it happened like 3 times every other day.   I cough and see blurry then pass out.   I was in the hospital for this same thing 5 yrs ago.   "My oxygen and blood mix together". 2. LIGHTHEADED: "Do you feel lightheaded?" (e.g., somewhat faint, woozy, weak upon standing)     I pass out after coughing.   I get choked when I eat food.  This happens when it's too hot in the house or the food is spicy from hot sauce.    "I put hot sauce on everything". It's hard for me to catch my breath.   I smoke. 3. VERTIGO: "Do you feel like either you or the room is spinning or tilting?" (i.e. vertigo)     No 4. SEVERITY: "How bad is it?"  "Do you feel like you are going to faint?" "Can you stand and walk?"   - MILD: Feels slightly dizzy, but walking normally.   - MODERATE: Feels very unsteady when walking, but not falling; interferes with normal activities (e.g., school, work) .   - SEVERE: Unable to walk without falling, or requires assistance to walk without falling; feels like passing out now.      It's happening more often. 5. ONSET:  "When did the dizziness begin?"     This time it's been going on for a couple of weeks. I was tested for covid a couple of months ago.  It was tested. 6. AGGRAVATING FACTORS: "Does anything make it worse?" (e.g.,  standing, change in head position)     Hot sauce on my food and if it's hot in the house. 7. HEART RATE: "Can you tell me your heart rate?" "How many beats in 15 seconds?"  (Note: not all patients can do this)       Not asked 8. CAUSE: "What do you think is causing the dizziness?"     I think drinking hot food, drinking beer and smoking.    9. RECURRENT SYMPTOM: "Have you had dizziness before?" If Yes, ask: "When was the last time?" "What happened that time?"     Yes 5 yrs ago   It was syncope 10. OTHER SYMPTOMS: "Do you have any other symptoms?" (e.g., fever, chest pain, vomiting, diarrhea, bleeding)       I have diarrhea off and on.  11. PREGNANCY: "Is there any chance you are pregnant?" "When was your last menstrual period?"       N/A  Protocols used: DIZZINESS [facility]

## 2020-08-03 NOTE — Telephone Encounter (Signed)
Medication Refill - Medication: lisinopril, hydrochlorothiazide, amlodipine   Has the patient contacted their pharmacy? Yes.   (Agent: If no, request that the patient contact the pharmacy for the refill.) (Agent: If yes, when and what did the pharmacy advise?)  Preferred Pharmacy (with phone number or street name):  [facility] - [place], [place] - [address]  [address] [place] [address]  Phone: [phone] Fax: [phone]  Hours: Not open 24 hours     Agent: Please be advised that RX refills may take up to 3 business days. We ask that you follow-up with your pharmacy.

## 2020-08-04 NOTE — Telephone Encounter (Signed)
Pt contacted.In person appt per pt request scheduled for 12/15

## 2020-08-05 ENCOUNTER — Ambulatory Visit (INDEPENDENT_AMBULATORY_CARE_PROVIDER_SITE_OTHER): Payer: Self-pay | Admitting: Family

## 2020-08-05 ENCOUNTER — Other Ambulatory Visit: Payer: Self-pay

## 2020-08-05 ENCOUNTER — Encounter: Payer: Self-pay | Admitting: Family

## 2020-08-05 VITALS — BP 131/81 | HR 94 | Wt 156.7 lb

## 2020-08-05 DIAGNOSIS — Z23 Encounter for immunization: Secondary | ICD-10-CM

## 2020-08-05 DIAGNOSIS — I10 Essential (primary) hypertension: Secondary | ICD-10-CM

## 2020-08-05 DIAGNOSIS — Z7689 Persons encountering health services in other specified circumstances: Secondary | ICD-10-CM

## 2020-08-05 NOTE — Patient Instructions (Addendum)
Continue Amlodipine, Hydrochlorothiazide, and Lisinopril for high blood pressure. Follow-up in 3 months with primary provider.   Lab today.   Flu vaccine today. Hypertension, Adult Hypertension is another name for high blood pressure. High blood pressure forces your heart to work harder to pump blood. This can cause problems over time. There are two numbers in a blood pressure reading. There is a top number (systolic) over a bottom number (diastolic). It is best to have a blood pressure that is below 120/80. Healthy choices can help lower your blood pressure, or you may need medicine to help lower it. What are the causes? The cause of this condition is not known. Some conditions may be related to high blood pressure. What increases the risk?  Smoking.  Having type 2 diabetes mellitus, high cholesterol, or both.  Not getting enough exercise or physical activity.  Being overweight.  Having too much fat, sugar, calories, or salt (sodium) in your diet.  Drinking too much alcohol.  Having long-term (chronic) kidney disease.  Having a family history of high blood pressure.  Age. Risk increases with age.  Race. You may be at higher risk if you are African American.  Gender. Men are at higher risk than women before age 76. After age 31, women are at higher risk than men.  Having obstructive sleep apnea.  Stress. What are the signs or symptoms?  High blood pressure may not cause symptoms. Very high blood pressure (hypertensive crisis) may cause: ? Headache. ? Feelings of worry or nervousness (anxiety). ? Shortness of breath. ? Nosebleed. ? A feeling of being sick to your stomach (nausea). ? Throwing up (vomiting). ? Changes in how you see. ? Very bad chest pain. ? Seizures. How is this treated?  This condition is treated by making healthy lifestyle changes, such as: ? Eating healthy foods. ? Exercising more. ? Drinking less alcohol.  Your health care provider may  prescribe medicine if lifestyle changes are not enough to get your blood pressure under control, and if: ? Your top number is above 130. ? Your bottom number is above 80.  Your personal target blood pressure may vary. Follow these instructions at home: Eating and drinking   If told, follow the DASH eating plan. To follow this plan: ? Fill one half of your plate at each meal with fruits and vegetables. ? Fill one fourth of your plate at each meal with whole grains. Whole grains include whole-wheat pasta, brown rice, and whole-grain bread. ? Eat or drink low-fat dairy products, such as skim milk or low-fat yogurt. ? Fill one fourth of your plate at each meal with low-fat (lean) proteins. Low-fat proteins include fish, chicken without skin, eggs, beans, and tofu. ? Avoid fatty meat, cured and processed meat, or chicken with skin. ? Avoid pre-made or processed food.  Eat less than 1,500 mg of salt each day.  Do not drink alcohol if: ? Your doctor tells you not to drink. ? You are pregnant, may be pregnant, or are planning to become pregnant.  If you drink alcohol: ? Limit how much you use to:  0-1 drink a day for women.  0-2 drinks a day for men. ? Be aware of how much alcohol is in your drink. In the [place] one drink equals one 12 oz bottle of beer (355 mL), one 5 oz glass of wine (148 mL), or one 1 oz glass of hard liquor (44 mL). Lifestyle   Work with your doctor to stay at  a healthy weight or to lose weight. Ask your doctor what the best weight is for you.  Get at least 30 minutes of exercise most days of the week. This may include walking, swimming, or biking.  Get at least 30 minutes of exercise that strengthens your muscles (resistance exercise) at least 3 days a week. This may include lifting weights or doing Pilates.  Do not use any products that contain nicotine or tobacco, such as cigarettes, e-cigarettes, and chewing tobacco. If you need help quitting, ask your  doctor.  Check your blood pressure at home as told by your doctor.  Keep all follow-up visits as told by your doctor. This is important. Medicines  Take over-the-counter and prescription medicines only as told by your doctor. Follow directions carefully.  Do not skip doses of blood pressure medicine. The medicine does not work as well if you skip doses. Skipping doses also puts you at risk for problems.  Ask your doctor about side effects or reactions to medicines that you should watch for. Contact a doctor if you:  Think you are having a reaction to the medicine you are taking.  Have headaches that keep coming back (recurring).  Feel dizzy.  Have swelling in your ankles.  Have trouble with your vision. Get help right away if you:  Get a very bad headache.  Start to feel mixed up (confused).  Feel weak or numb.  Feel faint.  Have very bad pain in your: ? Chest. ? Belly (abdomen).  Throw up more than once.  Have trouble breathing. Summary  Hypertension is another name for high blood pressure.  High blood pressure forces your heart to work harder to pump blood.  For most people, a normal blood pressure is less than 120/80.  Making healthy choices can help lower blood pressure. If your blood pressure does not get lower with healthy choices, you may need to take medicine. This information is not intended to replace advice given to you by your health care provider. Make sure you discuss any questions you have with your health care provider. Document Revised: 04/18/2018 Document Reviewed: 04/18/2018 Elsevier Patient Education  2020 [employer].

## 2020-08-05 NOTE — Progress Notes (Signed)
Patient ID: [name], male    DOB: [birth year]  MRN: [id number]  CC: Establish Care and Hypertension Follow-Up  Subjective: Jimmy Zimmerman is a 54 y.o. male with history of essential hypertension, acute pancreatitis, nicotine dependence, alcohol abuse, hypokalemia, alcoholic ketoacidosis, and elevated liver enzymes who presents to establish care and hypertension follow-up.   1. HYPERTENSION FOLLOW-UP: 02/13/2020: Continued on Amlodipine, Hydrochlorothiazide, and Lisinopril. CMP, CBC, and lipid panel obtained.   08/05/2020: Currently taking: see medication list Med Adherence: [x]  Yes    []  No  Medication side effects: []  Yes    [x]  No Adherence with salt restriction: []  Yes    [x]  No Exercise: Yes []  No [x]  Home Monitoring?: []  Yes    [x]  No Smoking [x]  Yes []  No SOB? []  Yes    [x]  No Chest Pain?: []  Yes    [x]  No Leg swelling?: []  Yes    [x]  No Headaches?: []  Yes    [x]  No Dizziness? []  Yes    [x]  No Comments: Reports ate some homemade spicy chicken wings over the weekend. Says someone in his family cooked it for him. No previous reactions to chicken. Not sure what kind of hot sauce was on the chicken. After eating chicken wings says he was told from his family that he passed out for about 3 minutes. Says they were able to get him awake. Was drinking alcohol during the same time. Says when he came to he felt normal. Did not seek medical attention at that time. Feeling well today.      Patient Active Problem List   Diagnosis Date Noted  . Elevated liver enzymes   . Alcoholic ketoacidosis   . Abnormal (ultrasound) of abdomen 04/19/2015  . Acute pancreatitis 04/18/2015  . Alcohol abuse 04/18/2015  . Hypokalemia 04/18/2015  . Essential hypertension 05/27/2014  . Nicotine dependence 05/27/2014  . Family history of diabetes mellitus (DM) 05/27/2014     Current Outpatient Medications on File Prior to Visit  Medication Sig Dispense Refill  . amLODipine (NORVASC) 10 MG tablet  Take 1 tablet (10 mg total) by mouth daily. 30 tablet 3  . hydrochlorothiazide (HYDRODIURIL) 25 MG tablet Take 1 tablet (25 mg total) by mouth daily. 30 tablet 3  . lisinopril (ZESTRIL) 40 MG tablet Take 1 tablet (40 mg total) by mouth daily. 30 tablet 3  . nicotine (NICODERM CQ) 21 mg/24hr patch Place 1 patch (21 mg total) onto the skin daily. (Patient not taking: No sig reported) 28 patch 1  . traMADol (ULTRAM) 50 MG tablet Take 1 tablet (50 mg total) by mouth every 6 (six) hours as needed for severe pain. (Patient not taking: No sig reported) 15 tablet 0   No current facility-administered medications on file prior to visit.    No Known Allergies  Social History   Socioeconomic History  . Marital status: Single    Spouse name: Not on file  . Number of children: Not on file  . Years of education: Not on file  . Highest education level: Not on file  Occupational History  . Not on file  Tobacco Use  . Smoking status: Current Every Day Smoker    Packs/day: 0.50    Years: 30.00    Pack years: 15.00    Types: Cigarettes  . Smokeless tobacco: Never Used  Vaping Use  . Vaping Use: Never used  Substance and Sexual Activity  . Alcohol use: Yes    Alcohol/week: 6.0 standard drinks  Types: 6 Cans of beer per week    Comment: everyday   . Drug use: No  . Sexual activity: Not on file  Other Topics Concern  . Not on file  Social History Narrative  . Not on file   Social Determinants of Health   Financial Resource Strain: Not on file  Food Insecurity: Not on file  Transportation Needs: Not on file  Physical Activity: Not on file  Stress: Not on file  Social Connections: Not on file  Intimate Partner Violence: Not on file    Family History  Problem Relation Age of Onset  . Hypertension Mother   . Diabetes Mother   . Stroke Maternal Grandmother     Past Surgical History:  Procedure Laterality Date  . HEMORRHOID SURGERY    . right knee surgery       ROS: Review of  Systems Negative except as stated above  PHYSICAL EXAM: BP 131/81 (BP Location: Left Arm, Patient Position: Sitting)   Pulse 94   Wt 156 lb 11.2 oz (71.1 kg)   SpO2 95%   BMI 24.54 kg/m   Physical Exam Constitutional:      Appearance: Normal appearance.  HENT:     Head: Normocephalic.  Eyes:     Extraocular Movements: Extraocular movements intact.     Pupils: Pupils are equal, round, and reactive to light.  Cardiovascular:     Rate and Rhythm: Normal rate and regular rhythm.     Pulses: Normal pulses.     Heart sounds: Normal heart sounds.  Pulmonary:     Effort: Pulmonary effort is normal.     Breath sounds: Normal breath sounds.  Musculoskeletal:     Cervical back: Normal range of motion and neck supple.  Neurological:     General: No focal deficit present.     Mental Status: He is alert and oriented to person, place, and time.  Psychiatric:        Mood and Affect: Mood normal.        Behavior: Behavior normal.        Thought Content: Thought content normal.    ASSESSMENT AND PLAN: 1. Encounter to establish care: - Patient presents today to establish care.  - Return in 4 to 6 weeks or sooner if needed for annual physical examination, labs, and health maintenance.   2. Essential hypertension: - Blood pressure close to goal at today's visit.  - Continue Amlodipine, Hydrochlorothiazide, and Lisinopril as prescribed. Medications refilled on 08/03/2020 for 3 month supply.  - Counseled on blood pressure goal of less than 130/80, low-sodium, DASH diet, medication compliance, 150 minutes of moderate intensity exercise per week as tolerated. Discussed medication compliance, adverse effects. - BMP to check kidney function and electrolyte balance.  - Follow-up in 3 months or sooner if needed.  - Basic Metabolic Panel  3. Need for immunization against influenza: - Flu vaccine administered today in clinic.  - Flu Vaccine QUAD 36+ mos IM  Patient was given the opportunity to  ask questions.  Patient verbalized understanding of the plan and was able to repeat key elements of the plan. Patient was given clear instructions to go to Emergency Department or return to medical center if symptoms don't improve, worsen, or new problems develop.The patient verbalized understanding.   Orders Placed This Encounter  Procedures  . Flu Vaccine QUAD 36+ mos IM  . Basic Metabolic Panel     Requested Prescriptions    No prescriptions requested or ordered in  this encounter    Return in about 3 months (around 11/03/2020) for Ricky Stabs, NP.  Rema Fendt, NP

## 2020-08-05 NOTE — Progress Notes (Signed)
Pt claims to have fainting spell 4 days ago ate some spicy food and was drinking alcohol- No symptoms today

## 2020-08-06 LAB — BASIC METABOLIC PANEL
BUN/Creatinine Ratio: 9 (ref 9–20)
BUN: 9 mg/dL (ref 6–24)
CO2: 21 mmol/L (ref 20–29)
Calcium: 9.3 mg/dL (ref 8.7–10.2)
Chloride: 103 mmol/L (ref 96–106)
Creatinine, Ser: 1.02 mg/dL (ref 0.76–1.27)
GFR calc Af Amer: 95 mL/min/{1.73_m2} (ref >59)
GFR calc non Af Amer: 82 mL/min/{1.73_m2} (ref >59)
Glucose: 80 mg/dL (ref 65–99)
Potassium: 4.5 mmol/L (ref 3.5–5.2)
Sodium: 141 mmol/L (ref 134–144)

## 2020-08-06 NOTE — Progress Notes (Signed)
Please call patient with update.   Kidney function normal. Keep all appointments with primary provider.

## 2020-09-09 MED FILL — HYDROCHLOROTHIAZIDE 25 MG T: 25 | 30 days supply | Qty: 30 | Fill #1

## 2020-09-09 MED FILL — LISINOPRIL 40 MG TABLET: 40 | 30 days supply | Qty: 30 | Fill #1

## 2020-09-09 MED FILL — AMLODIPINE BESYLATE 10 MG T: 10 | 30 days supply | Qty: 30 | Fill #1

## 2020-11-04 ENCOUNTER — Encounter: Payer: Self-pay | Admitting: Family

## 2020-11-04 NOTE — Progress Notes (Signed)
Patient did not show for appointment.   

## 2021-01-01 ENCOUNTER — Other Ambulatory Visit: Payer: Self-pay

## 2021-01-01 MED FILL — Lisinopril Tab 40 MG: ORAL | 30 days supply | Qty: 30 | Fill #0 | Status: CN

## 2021-01-01 MED FILL — Amlodipine Besylate Tab 10 MG (Base Equivalent): ORAL | 30 days supply | Qty: 30 | Fill #0 | Status: CN

## 2021-01-01 MED FILL — Hydrochlorothiazide Tab 25 MG: ORAL | 30 days supply | Qty: 30 | Fill #0 | Status: CN

## 2021-01-04 ENCOUNTER — Other Ambulatory Visit: Payer: Self-pay

## 2021-01-11 ENCOUNTER — Other Ambulatory Visit: Payer: Self-pay

## 2021-01-11 MED FILL — Lisinopril Tab 40 MG: ORAL | 30 days supply | Qty: 30 | Fill #0 | Status: AC

## 2021-01-11 MED FILL — Amlodipine Besylate Tab 10 MG (Base Equivalent): ORAL | 30 days supply | Qty: 30 | Fill #0 | Status: AC

## 2021-01-11 MED FILL — Hydrochlorothiazide Tab 25 MG: ORAL | 30 days supply | Qty: 30 | Fill #0 | Status: AC

## 2021-01-15 ENCOUNTER — Other Ambulatory Visit: Payer: Self-pay

## 2021-03-18 ENCOUNTER — Other Ambulatory Visit: Payer: Self-pay | Admitting: Family Medicine

## 2021-03-18 ENCOUNTER — Other Ambulatory Visit: Payer: Self-pay

## 2021-03-18 NOTE — Telephone Encounter (Signed)
Please schedule appointment for Amlodipine, Hydrochlorothiazide, and Lisinopril refills.

## 2021-03-30 ENCOUNTER — Other Ambulatory Visit: Payer: Self-pay

## 2021-03-30 ENCOUNTER — Other Ambulatory Visit: Payer: Self-pay | Admitting: Family Medicine

## 2021-03-30 NOTE — Telephone Encounter (Signed)
Notes to clinic:  Patient was transferred over to [facility] to schedule appt    Requested Prescriptions  Pending Prescriptions Disp Refills   amLODipine (NORVASC) 10 MG tablet 30 tablet 3    Sig: TAKE 1 TABLET (10 MG TOTAL) BY MOUTH DAILY.      Cardiovascular:  Calcium Channel Blockers Failed - 03/30/2021  1:41 PM      Failed - Valid encounter within last 6 months    Recent Outpatient Visits           1 year ago Essential hypertension   [facility] [place], [place], NP   1 year ago No-show for appointment   [facility] [place], [place], NP   1 year ago Hypertensive urgency   [facility] [place], [place], MD   2 years ago Essential hypertension   [facility] [place], [place], MD   3 years ago Essential hypertension   [facility] [place], [place], [place]                Passed - Last BP in normal range    BP Readings from Last 1 Encounters:  08/05/20 131/81            hydrochlorothiazide (HYDRODIURIL) 25 MG tablet 30 tablet 3    Sig: TAKE 1 TABLET (25 MG TOTAL) BY MOUTH DAILY.      Cardiovascular: Diuretics - Thiazide Failed - 03/30/2021  1:41 PM      Failed - Valid encounter within last 6 months    Recent Outpatient Visits           1 year ago Essential hypertension   [facility] [place], [place] J, NP   1 year ago No-show for appointment   [facility] [place], [place] J, NP   1 year ago Hypertensive urgency   [facility] [place], [place], MD   2 years ago Essential hypertension    [facility] [place], [place], MD   3 years ago Essential hypertension   [facility] [place], [place], [place]                Passed - Ca in normal range and within 360 days    Calcium  Date  Value Ref Range Status  08/05/2020 9.3 8.7 - 10.2 mg/dL Final   Calcium, Ion  Date Value Ref Range Status  05/02/2014 1.12 1.12 - 1.23 mmol/L Final          Passed - Cr in normal range and within 360 days    Creat  Date Value Ref Range Status  09/22/2014 1.03 0.50 - 1.35 mg/dL Final   Creatinine, Ser  Date Value Ref Range Status  08/05/2020 1.02 0.76 - 1.27 mg/dL Final          Passed - K in normal range and within 360 days    Potassium  Date Value Ref Range Status  08/05/2020 4.5 3.5 - 5.2 mmol/L Final          Passed - Na in normal range and within 360 days    Sodium  Date Value Ref Range Status  08/05/2020 141 134 - 144 mmol/L Final          Passed - Last BP in normal range    BP Readings from  Last 1 Encounters:  08/05/20 131/81            lisinopril (ZESTRIL) 40 MG tablet 30 tablet 3    Sig: TAKE 1 TABLET (40 MG TOTAL) BY MOUTH DAILY.      Cardiovascular:  ACE Inhibitors Failed - 03/30/2021  1:41 PM      Failed - Cr in normal range and within 180 days    Creat  Date Value Ref Range Status  09/22/2014 1.03 0.50 - 1.35 mg/dL Final   Creatinine, Ser  Date Value Ref Range Status  08/05/2020 1.02 0.76 - 1.27 mg/dL Final          Failed - K in normal range and within 180 days    Potassium  Date Value Ref Range Status  08/05/2020 4.5 3.5 - 5.2 mmol/L Final          Failed - Valid encounter within last 6 months    Recent Outpatient Visits           1 year ago Essential hypertension   [facility] [place], [place], NP   1 year ago No-show for appointment   [facility] [place], [place], NP   1 year ago Hypertensive urgency   [facility] [place], [place], MD   2 years ago Essential hypertension   [facility] [place], [place], MD   3 years ago Essential hypertension   [facility] [place], [place], [place]                 Passed - Patient is not pregnant      Passed - Last BP in normal range    BP Readings from Last 1 Encounters:  08/05/20 131/81

## 2021-03-31 ENCOUNTER — Other Ambulatory Visit: Payer: Self-pay

## 2021-04-01 ENCOUNTER — Other Ambulatory Visit: Payer: Self-pay

## 2021-04-23 ENCOUNTER — Ambulatory Visit (INDEPENDENT_AMBULATORY_CARE_PROVIDER_SITE_OTHER): Payer: Self-pay | Admitting: Family Medicine

## 2021-04-23 ENCOUNTER — Encounter: Payer: Self-pay | Admitting: Family Medicine

## 2021-04-23 ENCOUNTER — Other Ambulatory Visit: Payer: Self-pay

## 2021-04-23 VITALS — BP 178/105 | HR 100 | Temp 98.1°F | Resp 16 | Ht 66.25 in | Wt 151.6 lb

## 2021-04-23 DIAGNOSIS — F17200 Nicotine dependence, unspecified, uncomplicated: Secondary | ICD-10-CM

## 2021-04-23 DIAGNOSIS — R55 Syncope and collapse: Secondary | ICD-10-CM

## 2021-04-23 DIAGNOSIS — I10 Essential (primary) hypertension: Secondary | ICD-10-CM

## 2021-04-23 DIAGNOSIS — F17210 Nicotine dependence, cigarettes, uncomplicated: Secondary | ICD-10-CM

## 2021-04-23 DIAGNOSIS — F1010 Alcohol abuse, uncomplicated: Secondary | ICD-10-CM

## 2021-04-23 DIAGNOSIS — R059 Cough, unspecified: Secondary | ICD-10-CM

## 2021-04-23 MED ORDER — HYDROCHLOROTHIAZIDE 25 MG PO TABS
ORAL_TABLET | Freq: Every day | ORAL | 3 refills | Status: DC
  Filled 2021-04-23 (×2): qty 30, 30d supply, fill #0
  Filled 2021-05-27 – 2021-06-04 (×2): qty 30, 30d supply, fill #1
  Filled 2021-07-02: qty 30, 30d supply, fill #2
  Filled 2021-08-12: qty 30, 30d supply, fill #3

## 2021-04-23 MED ORDER — AMLODIPINE BESYLATE 10 MG PO TABS
ORAL_TABLET | Freq: Every day | ORAL | 3 refills | Status: DC
  Filled 2021-04-23 (×2): qty 30, 30d supply, fill #0
  Filled 2021-05-27 – 2021-06-04 (×2): qty 30, 30d supply, fill #1
  Filled 2021-07-02: qty 30, 30d supply, fill #2
  Filled 2021-08-12: qty 30, 30d supply, fill #3

## 2021-04-23 MED ORDER — LOSARTAN POTASSIUM 100 MG PO TABS
100.0000 mg | ORAL_TABLET | Freq: Every day | ORAL | 3 refills | Status: DC
  Filled 2021-04-23 (×2): qty 30, 30d supply, fill #0
  Filled 2021-05-27 – 2021-06-04 (×2): qty 30, 30d supply, fill #1
  Filled 2021-07-02: qty 30, 30d supply, fill #2
  Filled 2021-08-12: qty 30, 30d supply, fill #3

## 2021-04-23 NOTE — Progress Notes (Signed)
Patient is here for medication refill . Patient said he has dizzy spells  often and pass out.

## 2021-04-23 NOTE — Progress Notes (Signed)
Established Patient Office Visit  Subjective:  Patient ID: Jimmy Zimmerman, male    DOB: 1965-08-28  Age: 55 y.o. MRN: [id number]  CC:  Chief Complaint  Patient presents with   Medication Refill   Dizziness    HPI Jimmy Zimmerman for follow-up of hypertension with med for refills.  Patient reports that he has been out of his medications.  Reports a cough that has been persistent.  Patient reports that the cough causes him to pass out.  Denies any fever chills or viral symptoms.  Patient continues to drink alcohol and smoke cigarettes.  He reports that he did not tolerate the Chantix well.  Past Medical History:  Diagnosis Date   Hypertension    Pancreatitis     Past Surgical History:  Procedure Laterality Date   HEMORRHOID SURGERY     right knee surgery       Family History  Problem Relation Age of Onset   Hypertension Mother    Diabetes Mother    Stroke Maternal Grandmother     Social History   Socioeconomic History   Marital status: Single    Spouse name: Not on file   Number of children: Not on file   Years of education: Not on file   Highest education level: Not on file  Occupational History   Not on file  Tobacco Use   Smoking status: Every Day    Packs/day: 0.50    Years: 30.00    Pack years: 15.00    Types: Cigarettes   Smokeless tobacco: Never  Vaping Use   Vaping Use: Never used  Substance and Sexual Activity   Alcohol use: Yes    Alcohol/week: 6.0 standard drinks    Types: 6 Cans of beer per week    Comment: everyday    Drug use: No   Sexual activity: Not on file  Other Topics Concern   Not on file  Social History Narrative   Not on file   Social Determinants of Health   Financial Resource Strain: Not on file  Food Insecurity: Not on file  Transportation Needs: Not on file  Physical Activity: Not on file  Stress: Not on file  Social Connections: Not on file  Intimate Partner Violence: Not on file    Outpatient Medications Prior to  Visit  Medication Sig Dispense Refill   amLODipine (NORVASC) 10 MG tablet Take 1 tablet (10 mg total) by mouth daily. 30 tablet 3   amLODipine (NORVASC) 10 MG tablet TAKE 1 TABLET (10 MG TOTAL) BY MOUTH DAILY. 30 tablet 3   hydrochlorothiazide (HYDRODIURIL) 25 MG tablet Take 1 tablet (25 mg total) by mouth daily. 30 tablet 3   hydrochlorothiazide (HYDRODIURIL) 25 MG tablet TAKE 1 TABLET (25 MG TOTAL) BY MOUTH DAILY. 30 tablet 3   lisinopril (ZESTRIL) 40 MG tablet Take 1 tablet (40 mg total) by mouth daily. 30 tablet 3   lisinopril (ZESTRIL) 40 MG tablet TAKE 1 TABLET (40 MG TOTAL) BY MOUTH DAILY. 30 tablet 3   nicotine (NICODERM CQ) 21 mg/24hr patch Place 1 patch (21 mg total) onto the skin daily. 28 patch 1   traMADol (ULTRAM) 50 MG tablet Take 1 tablet (50 mg total) by mouth every 6 (six) hours as needed for severe pain. 15 tablet 0   amLODipine (NORVASC) 10 MG tablet TAKE 1 TABLET (10 MG TOTAL) BY MOUTH DAILY. 30 tablet 3   hydrochlorothiazide (HYDRODIURIL) 25 MG tablet TAKE 1 TABLET (25 MG TOTAL) BY  MOUTH DAILY. 30 tablet 3   lisinopril (ZESTRIL) 40 MG tablet TAKE 1 TABLET (40 MG TOTAL) BY MOUTH DAILY. 30 tablet 3   No facility-administered medications prior to visit.    No Known Allergies  ROS Review of Systems  Respiratory:  Positive for cough. Negative for shortness of breath.   Cardiovascular:  Negative for chest pain.  Neurological:  Positive for syncope.  All other systems reviewed and are negative.    Objective:    Physical Exam Vitals and nursing note reviewed.  Constitutional:      General: He is not in acute distress. Cardiovascular:     Rate and Rhythm: Normal rate and regular rhythm.  Pulmonary:     Effort: Pulmonary effort is normal.     Breath sounds: Normal breath sounds.  Abdominal:     Palpations: Abdomen is soft.     Tenderness: There is no abdominal tenderness.  Musculoskeletal:     Right lower leg: No edema.     Left lower leg: No edema.   Neurological:     General: No focal deficit present.     Mental Status: He is alert and oriented to person, place, and time.    BP (!) 178/105 (BP Location: Right Arm, Patient Position: Sitting, Cuff Size: Normal)   Pulse 100   Temp 98.1 F (36.7 C) (Oral)   Resp 16   Ht 5' 6.25" (1.683 m)   Wt 151 lb 9.6 oz (68.8 kg)   SpO2 94%   BMI 24.28 kg/m  Wt Readings from Last 3 Encounters:  04/23/21 151 lb 9.6 oz (68.8 kg)  08/05/20 156 lb 11.2 oz (71.1 kg)  02/13/20 146 lb (66.2 kg)     Health Maintenance Due  Topic Date Due   COVID-19 Vaccine (1) Never done   Pneumococcal Vaccine 66-52 Years old (1 - PCV) Never done   Hepatitis C Screening  Never done   Zoster Vaccines- Shingrix (1 of 2) Never done   INFLUENZA VACCINE  03/22/2021    There are no preventive care reminders to display for this patient.         Assessment & Plan:   1. Uncontrolled hypertension Will d/c lisinopril and prescribe losartan 100mg  daily with present regimen - meds refilled  2. Cough ? 2/2 lisinopril - med change as above  3. Syncope, unspecified syncope type Patient deferred further eval/mgt at this time  4. Alcohol abuse Discussed cessation  5. Nicotine dependence, uncomplicated, unspecified nicotine product type Discussed cessation    Follow-up: Return in about 1 week (around 04/30/2021) for follow up, chronic med issues.    06/30/2021, MD

## 2021-04-30 ENCOUNTER — Ambulatory Visit: Payer: Self-pay | Admitting: Family Medicine

## 2021-05-12 ENCOUNTER — Other Ambulatory Visit (HOSPITAL_COMMUNITY): Payer: Self-pay

## 2021-05-27 ENCOUNTER — Other Ambulatory Visit: Payer: Self-pay

## 2021-06-03 ENCOUNTER — Other Ambulatory Visit: Payer: Self-pay

## 2021-06-04 ENCOUNTER — Other Ambulatory Visit: Payer: Self-pay

## 2021-07-02 ENCOUNTER — Other Ambulatory Visit: Payer: Self-pay

## 2021-07-05 ENCOUNTER — Other Ambulatory Visit: Payer: Self-pay

## 2021-07-07 ENCOUNTER — Other Ambulatory Visit: Payer: Self-pay

## 2021-08-12 ENCOUNTER — Other Ambulatory Visit: Payer: Self-pay

## 2021-08-17 ENCOUNTER — Other Ambulatory Visit: Payer: Self-pay | Admitting: Family

## 2021-08-17 ENCOUNTER — Other Ambulatory Visit: Payer: Self-pay

## 2021-08-17 MED ORDER — LOSARTAN POTASSIUM 100 MG PO TABS
100.0000 mg | ORAL_TABLET | Freq: Every day | ORAL | 0 refills | Status: DC
  Filled 2021-08-17: qty 90, 90d supply, fill #0

## 2021-08-17 MED ORDER — AMLODIPINE BESYLATE 10 MG PO TABS
10.0000 mg | ORAL_TABLET | Freq: Every day | ORAL | 0 refills | Status: DC
  Filled 2021-08-17: qty 90, 90d supply, fill #0

## 2021-08-17 MED ORDER — HYDROCHLOROTHIAZIDE 25 MG PO TABS
ORAL_TABLET | Freq: Every day | ORAL | 0 refills | Status: DC
  Filled 2021-08-17: qty 90, fill #0

## 2021-09-13 ENCOUNTER — Encounter: Payer: Self-pay | Admitting: Family Medicine

## 2021-09-13 ENCOUNTER — Other Ambulatory Visit: Payer: Self-pay

## 2021-09-13 ENCOUNTER — Ambulatory Visit (INDEPENDENT_AMBULATORY_CARE_PROVIDER_SITE_OTHER): Payer: Self-pay | Admitting: Family Medicine

## 2021-09-13 VITALS — BP 135/86 | HR 98 | Temp 98.0°F | Resp 16 | Ht 67.0 in | Wt 154.0 lb

## 2021-09-13 DIAGNOSIS — Z23 Encounter for immunization: Secondary | ICD-10-CM

## 2021-09-13 DIAGNOSIS — I10 Essential (primary) hypertension: Secondary | ICD-10-CM

## 2021-09-13 DIAGNOSIS — F17200 Nicotine dependence, unspecified, uncomplicated: Secondary | ICD-10-CM

## 2021-09-13 DIAGNOSIS — F17210 Nicotine dependence, cigarettes, uncomplicated: Secondary | ICD-10-CM

## 2021-09-13 MED ORDER — NICOTINE 21 MG/24HR TD PT24
21.0000 mg | MEDICATED_PATCH | Freq: Every day | TRANSDERMAL | 1 refills | Status: AC
  Filled 2021-09-13: qty 28, 28d supply, fill #0
  Filled 2022-05-10 – 2022-05-17 (×2): qty 28, 28d supply, fill #1

## 2021-09-13 MED ORDER — LOSARTAN POTASSIUM 100 MG PO TABS
100.0000 mg | ORAL_TABLET | Freq: Every day | ORAL | 1 refills | Status: DC
  Filled 2021-09-13: qty 30, 30d supply, fill #0
  Filled 2021-10-15: qty 30, 30d supply, fill #1
  Filled 2021-11-17: qty 30, 30d supply, fill #2
  Filled 2021-12-24: qty 30, 30d supply, fill #3
  Filled 2022-01-18 – 2022-01-28 (×2): qty 30, 30d supply, fill #4
  Filled 2022-02-28: qty 30, 30d supply, fill #5

## 2021-09-13 MED ORDER — HYDROCHLOROTHIAZIDE 25 MG PO TABS
ORAL_TABLET | Freq: Every day | ORAL | 1 refills | Status: DC
  Filled 2021-09-13: qty 30, 30d supply, fill #0
  Filled 2021-10-15: qty 30, 30d supply, fill #1
  Filled 2021-11-17: qty 30, 30d supply, fill #2
  Filled 2021-12-24: qty 30, 30d supply, fill #3
  Filled 2022-01-18 – 2022-01-28 (×2): qty 30, 30d supply, fill #4
  Filled 2022-02-28: qty 30, 30d supply, fill #5

## 2021-09-13 MED ORDER — NICOTINE 21 MG/24HR TD PT24: 21.0000 mg | MEDICATED_PATCH | Freq: Every day | TRANSDERMAL | 1 refills | Status: DC

## 2021-09-13 MED ORDER — AMLODIPINE BESYLATE 10 MG PO TABS
10.0000 mg | ORAL_TABLET | Freq: Every day | ORAL | 1 refills | Status: DC
  Filled 2021-09-13: qty 30, 30d supply, fill #0
  Filled 2021-10-15: qty 30, 30d supply, fill #1
  Filled 2021-11-17: qty 30, 30d supply, fill #2
  Filled 2021-12-24: qty 30, 30d supply, fill #3
  Filled 2022-01-18 – 2022-01-28 (×2): qty 30, 30d supply, fill #4
  Filled 2022-02-28: qty 30, 30d supply, fill #5

## 2021-09-13 NOTE — Progress Notes (Signed)
Established Patient Office Visit  Subjective:  Patient ID: Jimmy Zimmerman, male    DOB: January 29, 1966  Age: 56 y.o. MRN: [id number]  CC:  Chief Complaint  Patient presents with   Medication Refill   Hypertension   Follow-up    HPI Jimmy Zimmerman presents for follow up of hypertension. Patient denies acute complaints or concerns.   Past Medical History:  Diagnosis Date   Hypertension    Pancreatitis     Past Surgical History:  Procedure Laterality Date   HEMORRHOID SURGERY     right knee surgery       Family History  Problem Relation Age of Onset   Hypertension Mother    Diabetes Mother    Stroke Maternal Grandmother     Social History   Socioeconomic History   Marital status: Single    Spouse name: Not on file   Number of children: Not on file   Years of education: Not on file   Highest education level: Not on file  Occupational History   Not on file  Tobacco Use   Smoking status: Every Day    Packs/day: 0.50    Years: 30.00    Pack years: 15.00    Types: Cigarettes   Smokeless tobacco: Never  Vaping Use   Vaping Use: Never used  Substance and Sexual Activity   Alcohol use: Yes    Alcohol/week: 6.0 standard drinks    Types: 6 Cans of beer per week    Comment: everyday    Drug use: No   Sexual activity: Not on file  Other Topics Concern   Not on file  Social History Narrative   Not on file   Social Determinants of Health   Financial Resource Strain: Not on file  Food Insecurity: Not on file  Transportation Needs: Not on file  Physical Activity: Not on file  Stress: Not on file  Social Connections: Not on file  Intimate Partner Violence: Not on file    ROS Review of Systems  All other systems reviewed and are negative.  Objective:   Today's Vitals: BP 135/86    Pulse 98    Temp 98 F (36.7 C) (Oral)    Resp 16    Ht 5\' 7"  (1.702 m)    Wt 154 lb (69.9 kg)    BMI 24.12 kg/m   Physical Exam Vitals and nursing note reviewed.   Constitutional:      General: He is not in acute distress. Cardiovascular:     Rate and Rhythm: Normal rate and regular rhythm.  Pulmonary:     Effort: Pulmonary effort is normal.     Breath sounds: Normal breath sounds.  Abdominal:     Palpations: Abdomen is soft.     Tenderness: There is no abdominal tenderness.  Musculoskeletal:     Right lower leg: No edema.     Left lower leg: No edema.  Neurological:     General: No focal deficit present.     Mental Status: He is alert and oriented to person, place, and time.    Assessment & Plan:   1. Essential hypertension Much improved reading.  with present management. Meds refilled. monitor  2. Ready to quit smoking Nicoderm prescribed  - nicotine (NICODERM CQ) 21 mg/24hr patch; Place 1 patch (21 mg total) onto the skin daily.  Dispense: 28 patch; Refill: 1    Outpatient Encounter Medications as of 09/13/2021  Medication Sig   [DISCONTINUED] amLODipine (NORVASC) 10  MG tablet Take 1 tablet (10 mg total) by mouth daily.   [DISCONTINUED] hydrochlorothiazide (HYDRODIURIL) 25 MG tablet TAKE 1 TABLET (25 MG TOTAL) BY MOUTH DAILY.   [DISCONTINUED] losartan (COZAAR) 100 MG tablet Take 1 tablet (100 mg total) by mouth daily.   [DISCONTINUED] nicotine (NICODERM CQ) 21 mg/24hr patch Place 1 patch (21 mg total) onto the skin daily.   amLODipine (NORVASC) 10 MG tablet Take 1 tablet (10 mg total) by mouth daily.   hydrochlorothiazide (HYDRODIURIL) 25 MG tablet TAKE 1 TABLET (25 MG TOTAL) BY MOUTH DAILY.   losartan (COZAAR) 100 MG tablet Take 1 tablet (100 mg total) by mouth daily.   nicotine (NICODERM CQ) 21 mg/24hr patch Place 1 patch (21 mg total) onto the skin daily.   [DISCONTINUED] traMADol (ULTRAM) 50 MG tablet Take 1 tablet (50 mg total) by mouth every 6 (six) hours as needed for severe pain.   No facility-administered encounter medications on file as of 09/13/2021.    Follow-up: Return in about 6 months (around 03/13/2022) for follow  up.   Tommie Raymond, MD

## 2021-09-15 ENCOUNTER — Other Ambulatory Visit: Payer: Self-pay

## 2021-09-16 ENCOUNTER — Other Ambulatory Visit: Payer: Self-pay

## 2021-10-15 ENCOUNTER — Other Ambulatory Visit: Payer: Self-pay

## 2021-10-18 ENCOUNTER — Other Ambulatory Visit: Payer: Self-pay

## 2021-11-17 ENCOUNTER — Other Ambulatory Visit: Payer: Self-pay

## 2021-11-18 ENCOUNTER — Other Ambulatory Visit: Payer: Self-pay

## 2021-12-22 ENCOUNTER — Other Ambulatory Visit: Payer: Self-pay

## 2021-12-24 ENCOUNTER — Other Ambulatory Visit: Payer: Self-pay

## 2022-01-18 ENCOUNTER — Other Ambulatory Visit: Payer: Self-pay

## 2022-01-25 ENCOUNTER — Other Ambulatory Visit: Payer: Self-pay

## 2022-01-28 ENCOUNTER — Other Ambulatory Visit: Payer: Self-pay

## 2022-02-28 ENCOUNTER — Other Ambulatory Visit: Payer: Self-pay

## 2022-03-03 ENCOUNTER — Other Ambulatory Visit: Payer: Self-pay

## 2022-03-07 ENCOUNTER — Ambulatory Visit: Payer: Self-pay | Admitting: Family Medicine

## 2022-03-29 ENCOUNTER — Other Ambulatory Visit: Payer: Self-pay | Admitting: Family Medicine

## 2022-03-29 ENCOUNTER — Other Ambulatory Visit: Payer: Self-pay

## 2022-03-29 ENCOUNTER — Ambulatory Visit: Payer: Self-pay | Admitting: Family Medicine

## 2022-03-30 ENCOUNTER — Other Ambulatory Visit: Payer: Self-pay

## 2022-03-30 MED ORDER — AMLODIPINE BESYLATE 10 MG PO TABS
10.0000 mg | ORAL_TABLET | Freq: Every day | ORAL | 1 refills | Status: DC
  Filled 2022-03-30 – 2022-04-05 (×2): qty 30, 30d supply, fill #0

## 2022-03-30 MED ORDER — LOSARTAN POTASSIUM 100 MG PO TABS
100.0000 mg | ORAL_TABLET | Freq: Every day | ORAL | 1 refills | Status: DC
  Filled 2022-03-30 – 2022-04-05 (×2): qty 30, 30d supply, fill #0

## 2022-03-30 MED ORDER — HYDROCHLOROTHIAZIDE 25 MG PO TABS
ORAL_TABLET | Freq: Every day | ORAL | 1 refills | Status: DC
  Filled 2022-03-30 – 2022-04-05 (×2): qty 30, 30d supply, fill #0

## 2022-03-30 NOTE — Telephone Encounter (Signed)
Requested medications are due for refill today.  yes  Requested medications are on the active medications list.  yes  Last refill. 09/13/2021 #90 1 refill for all 3  Future visit scheduled.   yes  Notes to clinic.  Labs are expired    Requested Prescriptions  Pending Prescriptions Disp Refills   losartan (COZAAR) 100 MG tablet 90 tablet 1    Sig: Take 1 tablet (100 mg total) by mouth daily.     Cardiovascular:  Angiotensin Receptor Blockers Failed - 03/29/2022  2:41 PM      Failed - Cr in normal range and within 180 days    Creat  Date Value Ref Range Status  09/22/2014 1.03 0.50 - 1.35 mg/dL Final   Creatinine, Ser  Date Value Ref Range Status  08/05/2020 1.02 0.76 - 1.27 mg/dL Final         Failed - K in normal range and within 180 days    Potassium  Date Value Ref Range Status  08/05/2020 4.5 3.5 - 5.2 mmol/L Final         Failed - Valid encounter within last 6 months    Recent Outpatient Visits           6 months ago Essential hypertension   Primary Care at [facility] [place], [place]   11 months ago Uncontrolled hypertension   Primary Care at [facility], MD   1 year ago Encounter to establish care   Primary Care at [facility], [place], [place]   2 years ago Essential hypertension   [facility] [place], [place], [place]   2 years ago No-show for appointment   [facility], [place], [place]       Future Appointments             In 3 weeks Georganna Skeans, MD Primary Care at [facility] - Patient is not pregnant      Passed - Last BP in normal range    BP Readings from Last 1 Encounters:  09/13/21 135/86          hydrochlorothiazide (HYDRODIURIL) 25 MG tablet 90 tablet 1    Sig: TAKE 1 TABLET (25 MG TOTAL) BY MOUTH DAILY.     Cardiovascular: Diuretics - Thiazide Failed - 03/29/2022  2:41 PM      Failed - Cr in normal range and within 180 days     Creat  Date Value Ref Range Status  09/22/2014 1.03 0.50 - 1.35 mg/dL Final   Creatinine, Ser  Date Value Ref Range Status  08/05/2020 1.02 0.76 - 1.27 mg/dL Final         Failed - K in normal range and within 180 days    Potassium  Date Value Ref Range Status  08/05/2020 4.5 3.5 - 5.2 mmol/L Final         Failed - Na in normal range and within 180 days    Sodium  Date Value Ref Range Status  08/05/2020 141 134 - 144 mmol/L Final         Failed - Valid encounter within last 6 months    Recent Outpatient Visits           6 months ago Essential hypertension   Primary Care at [facility], MD   11 months ago Uncontrolled hypertension   Primary  Care at [facility], MD   1 year ago Encounter to establish care   Primary Care at [facility], [place], [place]   2 years ago Essential hypertension   [facility] [place], [place], [place]   2 years ago No-show for appointment   [facility] [place], [place], [place]       Future Appointments             In 3 weeks Georganna Skeans, MD Primary Care at [facility] - Last BP in normal range    BP Readings from Last 1 Encounters:  09/13/21 135/86          amLODipine (NORVASC) 10 MG tablet 90 tablet 1    Sig: Take 1 tablet (10 mg total) by mouth daily.     Cardiovascular: Calcium Channel Blockers 2 Failed - 03/29/2022  2:41 PM      Failed - Valid encounter within last 6 months    Recent Outpatient Visits           6 months ago Essential hypertension   Primary Care at [facility], MD   11 months ago Uncontrolled hypertension   Primary Care at [facility], [place]   1 year ago Encounter to establish care   Primary Care at [facility], [place], [place]   2 years ago Essential hypertension   [facility] [place], [place], [place]   2 years ago No-show  for appointment   [facility]onia Kief, [place], [place]       Future Appointments             In 3 weeks Georganna Skeans, MD Primary Care at [facility] - Last BP in normal range    BP Readings from Last 1 Encounters:  09/13/21 135/86         Passed - Last Heart Rate in normal range    Pulse Readings from Last 1 Encounters:  09/13/21 98

## 2022-04-05 ENCOUNTER — Other Ambulatory Visit: Payer: Self-pay

## 2022-04-20 ENCOUNTER — Ambulatory Visit (INDEPENDENT_AMBULATORY_CARE_PROVIDER_SITE_OTHER): Payer: Self-pay | Admitting: Family Medicine

## 2022-04-20 ENCOUNTER — Other Ambulatory Visit: Payer: Self-pay

## 2022-04-20 DIAGNOSIS — F17200 Nicotine dependence, unspecified, uncomplicated: Secondary | ICD-10-CM

## 2022-04-20 DIAGNOSIS — I10 Essential (primary) hypertension: Secondary | ICD-10-CM

## 2022-04-20 MED ORDER — AMLODIPINE BESYLATE 10 MG PO TABS
10.0000 mg | ORAL_TABLET | Freq: Every day | ORAL | 1 refills | Status: DC
  Filled 2022-04-20: qty 90, 90d supply, fill #0
  Filled 2022-05-10 – 2022-05-17 (×2): qty 30, 30d supply, fill #0
  Filled 2022-07-01 – 2022-07-08 (×2): qty 30, 30d supply, fill #1
  Filled 2022-08-09: qty 30, 30d supply, fill #2
  Filled 2022-09-16: qty 30, 30d supply, fill #3
  Filled 2022-10-21 – 2022-10-28 (×2): qty 30, 30d supply, fill #4
  Filled 2022-12-06 – 2022-12-20 (×2): qty 30, 30d supply, fill #5

## 2022-04-20 MED ORDER — LOSARTAN POTASSIUM 100 MG PO TABS
100.0000 mg | ORAL_TABLET | Freq: Every day | ORAL | 1 refills | Status: DC
  Filled 2022-04-20 – 2022-05-10 (×2): qty 90, 90d supply, fill #0
  Filled 2022-05-17: qty 30, 30d supply, fill #0
  Filled 2022-07-01 – 2022-07-08 (×4): qty 30, 30d supply, fill #1
  Filled 2022-08-09: qty 30, 30d supply, fill #2
  Filled 2022-09-16: qty 30, 30d supply, fill #3
  Filled 2022-10-21 – 2022-10-28 (×2): qty 30, 30d supply, fill #4
  Filled 2022-12-06 – 2022-12-20 (×2): qty 30, 30d supply, fill #5

## 2022-04-20 MED ORDER — HYDROCHLOROTHIAZIDE 25 MG PO TABS
ORAL_TABLET | Freq: Every day | ORAL | 1 refills | Status: DC
  Filled 2022-04-20: qty 90, fill #0
  Filled 2022-05-10 – 2022-05-17 (×2): qty 30, 30d supply, fill #0
  Filled 2022-07-01 – 2022-07-08 (×2): qty 30, 30d supply, fill #1
  Filled 2022-08-09: qty 30, 30d supply, fill #2
  Filled 2022-09-16: qty 30, 30d supply, fill #3
  Filled 2022-10-21 – 2022-10-28 (×2): qty 30, 30d supply, fill #4
  Filled 2022-12-06 – 2022-12-20 (×2): qty 30, 30d supply, fill #5

## 2022-04-21 ENCOUNTER — Encounter: Payer: Self-pay | Admitting: Family Medicine

## 2022-04-21 NOTE — Progress Notes (Signed)
Established Patient Office Visit  Subjective    Patient ID: Jimmy Zimmerman, male    DOB: 1966-06-16  Age: 56 y.o. MRN: [id number]  CC:  Chief Complaint  Patient presents with   Follow-up   Hypertension    HPI Jimmy Zimmerman presents for routine follow up of hypertension. Patient denies acute complaints or concerns.    Outpatient Encounter Medications as of 04/20/2022  Medication Sig   nicotine (NICODERM CQ) 21 mg/24hr patch Place 1 patch (21 mg total) onto the skin daily.   [DISCONTINUED] amLODipine (NORVASC) 10 MG tablet Take 1 tablet (10 mg total) by mouth daily.   [DISCONTINUED] hydrochlorothiazide (HYDRODIURIL) 25 MG tablet TAKE 1 TABLET (25 MG TOTAL) BY MOUTH DAILY.   [DISCONTINUED] losartan (COZAAR) 100 MG tablet Take 1 tablet (100 mg total) by mouth daily.   amLODipine (NORVASC) 10 MG tablet Take 1 tablet (10 mg total) by mouth daily.   hydrochlorothiazide (HYDRODIURIL) 25 MG tablet TAKE 1 TABLET (25 MG TOTAL) BY MOUTH DAILY.   losartan (COZAAR) 100 MG tablet Take 1 tablet (100 mg total) by mouth daily.   No facility-administered encounter medications on file as of 04/20/2022.    Past Medical History:  Diagnosis Date   Hypertension    Pancreatitis     Past Surgical History:  Procedure Laterality Date   HEMORRHOID SURGERY     right knee surgery       Family History  Problem Relation Age of Onset   Hypertension Mother    Diabetes Mother    Stroke Maternal Grandmother     Social History   Socioeconomic History   Marital status: Single    Spouse name: Not on file   Number of children: Not on file   Years of education: Not on file   Highest education level: Not on file  Occupational History   Not on file  Tobacco Use   Smoking status: Every Day    Packs/day: 0.50    Years: 30.00    Total pack years: 15.00    Types: Cigarettes   Smokeless tobacco: Never  Vaping Use   Vaping Use: Never used  Substance and Sexual Activity   Alcohol use: Yes     Alcohol/week: 6.0 standard drinks of alcohol    Types: 6 Cans of beer per week    Comment: everyday    Drug use: No   Sexual activity: Not on file  Other Topics Concern   Not on file  Social History Narrative   Not on file   Social Determinants of Health   Financial Resource Strain: Not on file  Food Insecurity: Not on file  Transportation Needs: Not on file  Physical Activity: Not on file  Stress: Not on file  Social Connections: Not on file  Intimate Partner Violence: Not on file    Review of Systems  All other systems reviewed and are negative.       Objective    There were no vitals taken for this visit.  Physical Exam Vitals and nursing note reviewed.  Constitutional:      General: He is not in acute distress. Cardiovascular:     Rate and Rhythm: Normal rate and regular rhythm.  Pulmonary:     Effort: Pulmonary effort is normal.     Breath sounds: Normal breath sounds.  Abdominal:     Palpations: Abdomen is soft.     Tenderness: There is no abdominal tenderness.  Musculoskeletal:     Right lower  leg: No edema.     Left lower leg: No edema.  Neurological:     General: No focal deficit present.     Mental Status: He is alert and oriented to person, place, and time.         Assessment & Plan:   1. Essential hypertension Appears stable. Continue and monitor  2. Nicotine dependence, uncomplicated, unspecified nicotine product type Discussed cessation/reduction    Return in about 6 months (around 10/20/2022) for follow up.   Tommie Raymond, MD

## 2022-05-10 ENCOUNTER — Other Ambulatory Visit: Payer: Self-pay

## 2022-05-16 ENCOUNTER — Other Ambulatory Visit: Payer: Self-pay

## 2022-05-17 ENCOUNTER — Other Ambulatory Visit: Payer: Self-pay

## 2022-07-01 ENCOUNTER — Other Ambulatory Visit: Payer: Self-pay

## 2022-07-07 ENCOUNTER — Other Ambulatory Visit: Payer: Self-pay

## 2022-07-08 ENCOUNTER — Other Ambulatory Visit: Payer: Self-pay

## 2022-08-09 ENCOUNTER — Other Ambulatory Visit: Payer: Self-pay

## 2022-09-14 ENCOUNTER — Other Ambulatory Visit (HOSPITAL_COMMUNITY): Payer: Self-pay

## 2022-09-14 ENCOUNTER — Other Ambulatory Visit: Payer: Self-pay

## 2022-09-16 ENCOUNTER — Other Ambulatory Visit: Payer: Self-pay

## 2022-09-21 ENCOUNTER — Other Ambulatory Visit: Payer: Self-pay

## 2022-10-21 ENCOUNTER — Other Ambulatory Visit: Payer: Self-pay

## 2022-10-27 ENCOUNTER — Other Ambulatory Visit: Payer: Self-pay

## 2022-10-28 ENCOUNTER — Other Ambulatory Visit: Payer: Self-pay

## 2022-12-06 ENCOUNTER — Other Ambulatory Visit: Payer: Self-pay

## 2022-12-13 ENCOUNTER — Other Ambulatory Visit: Payer: Self-pay

## 2022-12-20 ENCOUNTER — Other Ambulatory Visit: Payer: Self-pay

## 2023-01-15 ENCOUNTER — Encounter (HOSPITAL_COMMUNITY): Payer: Self-pay

## 2023-01-15 ENCOUNTER — Ambulatory Visit (HOSPITAL_COMMUNITY)
Admission: EM | Admit: 2023-01-15 | Discharge: 2023-01-15 | Disposition: A | Discharge status: Home / Self Care | Payer: Self-pay | Attending: Emergency Medicine | Admitting: Emergency Medicine

## 2023-01-15 DIAGNOSIS — L02415 Cutaneous abscess of right lower limb: Secondary | ICD-10-CM

## 2023-01-15 MED ORDER — KETOROLAC TROMETHAMINE 30 MG/ML IJ SOLN
30.0000 mg | Freq: Once | INTRAMUSCULAR | Status: AC
  Administered 2023-01-15: 30 mg via INTRAMUSCULAR

## 2023-01-15 MED ORDER — DOXYCYCLINE HYCLATE 100 MG PO CAPS: 100.0000 mg | ORAL_CAPSULE | Freq: Two times a day (BID) | ORAL | 0 refills | Status: DC

## 2023-01-15 MED ORDER — KETOROLAC TROMETHAMINE 30 MG/ML IJ SOLN
INTRAMUSCULAR | Status: AC
  Filled 2023-01-15: qty 1

## 2023-01-15 NOTE — Discharge Instructions (Signed)
Area to the right hip is infected most likely due to an opening to the skin from unknown cause  Take doxycycline every morning and every evening for 7 days to clear infection  Hold warm compresses over the affected area in 10 to 15-minute intervals throughout the day to help soften tissue, provide comfort and to help the area drain  You may take Tylenol and/or Motrin every 6 hours as needed for management of pain  You were given an injection of Toradol here today in the office ideally you will start to see relief in about 30 minutes  Please follow-up with his urgent care for any concerns regarding healing

## 2023-01-15 NOTE — ED Triage Notes (Signed)
Bite on the right hip onset this morning. Area is red, swollen, as looks as though there is pus underneath. Patient having diarrhea onset last night.

## 2023-01-15 NOTE — ED Provider Notes (Signed)
[facility]    CSN: [id number] Arrival date & time: 01/15/23  1558      History   Chief Complaint Chief Complaint  Patient presents with   Insect Bite    HPI Jimmy Zimmerman is a 57 y.o. male.   Patient presents for evaluation of erythema, swelling and what he believes to be pus underneath the skin to the right hip.  Believes he may have been bitten by a spider but insect bite unwitnessed.  Has attempted to clean with peroxide with no relief.  Endorses that he has had diarrhea throughout the day described as soft and watery.  Denies presence of fever.   Past Medical History:  Diagnosis Date   Hypertension    Pancreatitis     Patient Active Problem List   Diagnosis Date Noted   Elevated liver enzymes    Alcoholic ketoacidosis    Abnormal [place] (ultrasound) of abdomen 04/19/2015   Acute pancreatitis 04/18/2015   Alcohol abuse 04/18/2015   Hypokalemia 04/18/2015   Essential hypertension 05/27/2014   Nicotine dependence 05/27/2014   Family history of diabetes mellitus (DM) 05/27/2014    Past Surgical History:  Procedure Laterality Date   HEMORRHOID SURGERY     right knee surgery          Home Medications    Prior to Admission medications   Medication Sig Start Date End Date Taking? Authorizing Provider  amLODipine (NORVASC) 10 MG tablet Take 1 tablet (10 mg total) by mouth daily. 04/20/22  Yes Georganna Skeans, MD  hydrochlorothiazide (HYDRODIURIL) 25 MG tablet TAKE 1 TABLET (25 MG TOTAL) BY MOUTH DAILY. 04/20/22  Yes Georganna Skeans, MD  losartan (COZAAR) 100 MG tablet Take 1 tablet (100 mg total) by mouth daily. 04/20/22  Yes Georganna Skeans, MD  nicotine (NICODERM CQ) 21 mg/24hr patch Place 1 patch (21 mg total) onto the skin daily. 09/13/21   Georganna Skeans, MD    Family History Family History  Problem Relation Age of Onset   Hypertension Mother    Diabetes Mother    Stroke Maternal Grandmother     Social History Social History   Tobacco Use    Smoking status: Every Day    Packs/day: 0.50    Years: 30.00    Additional pack years: 0.00    Total pack years: 15.00    Types: Cigarettes   Smokeless tobacco: Never  Vaping Use   Vaping Use: Never used  Substance Use Topics   Alcohol use: Yes    Alcohol/week: 6.0 standard drinks of alcohol    Types: 6 Cans of beer per week    Comment: everyday    Drug use: No     Allergies   Patient has no known allergies.   Review of Systems Review of Systems   Physical Exam Triage Vital Signs ED Triage Vitals  Enc Vitals Group     BP 01/15/23 1617 136/85     Pulse Rate 01/15/23 1617 94     Resp 01/15/23 1617 18     Temp 01/15/23 1617 98 F (36.7 C)     Temp Source 01/15/23 1617 Oral     SpO2 01/15/23 1617 94 %     Weight --      Height --      Head Circumference --      Peak Flow --      Pain Score 01/15/23 1615 10     Pain Loc --      Pain Edu? --  Excl. in GC? --    No data found.  Updated Vital Signs BP 136/85 (BP Location: Right Arm)   Pulse 94   Temp 98 F (36.7 C) (Oral)   Resp 18   SpO2 94%   Visual Acuity Right Eye Distance:   Left Eye Distance:   Bilateral Distance:    Right Eye Near:   Left Eye Near:    Bilateral Near:     Physical Exam Constitutional:      Appearance: Normal appearance.  Eyes:     Extraocular Movements: Extraocular movements intact.  Pulmonary:     Effort: Pulmonary effort is normal.  Skin:    Comments: 2 x 3 immature abscess present to the right hip with pus to the center, erythematous, tender and hot to touch  Neurological:     Mental Status: He is alert and oriented to person, place, and time. Mental status is at baseline.      UC Treatments / Results  Labs (all labs ordered are listed, but only abnormal results are displayed) Labs Reviewed - No data to display  EKG   Radiology No results found.  Procedures Procedures (including critical care time)  Medications Ordered in UC Medications - No data to  display  Initial Impression / Assessment and Plan / UC Course  I have reviewed the triage vital signs and the nursing notes.  Pertinent labs & imaging results that were available during my care of the patient were reviewed by me and considered in my medical decision making (see chart for details).  Abscess of the right hip  Presentation is consistent with infection, due to firmness of the site will not attempt incision and drainage, discussed with patient, placed on doxycycline and given Toradol injection prior to discharge to help minimize pain, recommended over-the-counter analgesics and warm compresses to help facilitate drainage and for comfort, given strict precautions for any concerns regarding healing the follow-up for reevaluation Final Clinical Impressions(s) / UC Diagnoses   Final diagnoses:  None   Discharge Instructions   None    ED Prescriptions   None    PDMP not reviewed this encounter.   Valinda Hoar, NP 01/15/23 [phone]

## 2023-01-21 DIAGNOSIS — Z419 Encounter for procedure for purposes other than remedying health state, unspecified: Secondary | ICD-10-CM | POA: Diagnosis not present

## 2023-01-26 ENCOUNTER — Ambulatory Visit (HOSPITAL_COMMUNITY): Payer: Self-pay

## 2023-01-26 ENCOUNTER — Ambulatory Visit (INDEPENDENT_AMBULATORY_CARE_PROVIDER_SITE_OTHER): Payer: Self-pay

## 2023-01-26 ENCOUNTER — Ambulatory Visit (HOSPITAL_COMMUNITY)
Admission: EM | Admit: 2023-01-26 | Discharge: 2023-01-26 | Disposition: A | Discharge status: Home / Self Care | Payer: Self-pay | Attending: Emergency Medicine | Admitting: Emergency Medicine

## 2023-01-26 ENCOUNTER — Emergency Department (HOSPITAL_COMMUNITY): Payer: Self-pay

## 2023-01-26 ENCOUNTER — Other Ambulatory Visit: Payer: Self-pay

## 2023-01-26 ENCOUNTER — Encounter (HOSPITAL_COMMUNITY): Payer: Self-pay | Admitting: Emergency Medicine

## 2023-01-26 ENCOUNTER — Emergency Department (HOSPITAL_COMMUNITY)
Admission: EM | Admit: 2023-01-26 | Discharge: 2023-01-27 | Disposition: A | Discharge status: Home / Self Care | Payer: Self-pay | Attending: Emergency Medicine | Admitting: Emergency Medicine

## 2023-01-26 DIAGNOSIS — S8261XA Displaced fracture of lateral malleolus of right fibula, initial encounter for closed fracture: Secondary | ICD-10-CM | POA: Insufficient documentation

## 2023-01-26 DIAGNOSIS — S01112A Laceration without foreign body of left eyelid and periocular area, initial encounter: Secondary | ICD-10-CM | POA: Insufficient documentation

## 2023-01-26 DIAGNOSIS — S0181XA Laceration without foreign body of other part of head, initial encounter: Secondary | ICD-10-CM

## 2023-01-26 DIAGNOSIS — S9304XA Dislocation of right ankle joint, initial encounter: Secondary | ICD-10-CM

## 2023-01-26 DIAGNOSIS — Z79899 Other long term (current) drug therapy: Secondary | ICD-10-CM | POA: Insufficient documentation

## 2023-01-26 DIAGNOSIS — I10 Essential (primary) hypertension: Secondary | ICD-10-CM | POA: Insufficient documentation

## 2023-01-26 DIAGNOSIS — Z23 Encounter for immunization: Secondary | ICD-10-CM | POA: Insufficient documentation

## 2023-01-26 DIAGNOSIS — S82831A Other fracture of upper and lower end of right fibula, initial encounter for closed fracture: Secondary | ICD-10-CM

## 2023-01-26 MED ORDER — KETOROLAC TROMETHAMINE 30 MG/ML IJ SOLN
INTRAMUSCULAR | Status: AC
  Filled 2023-01-26: qty 1

## 2023-01-26 MED ORDER — SODIUM CHLORIDE 0.9 % IV BOLUS
1000.0000 mL | Freq: Once | INTRAVENOUS | Status: AC
  Administered 2023-01-26: 1000 mL via INTRAVENOUS

## 2023-01-26 MED ORDER — KETOROLAC TROMETHAMINE 30 MG/ML IJ SOLN
30.0000 mg | Freq: Once | INTRAMUSCULAR | Status: AC
  Administered 2023-01-26: 30 mg via INTRAVENOUS

## 2023-01-26 MED ORDER — HYDROMORPHONE HCL 1 MG/ML IJ SOLN
0.5000 mg | Freq: Once | INTRAMUSCULAR | Status: AC
  Administered 2023-01-26: 0.5 mg via INTRAVENOUS
  Filled 2023-01-26: qty 1

## 2023-01-26 MED ORDER — PROPOFOL 10 MG/ML IV BOLUS
0.5000 mg/kg | Freq: Once | INTRAVENOUS | Status: AC
  Administered 2023-01-26: 100 mg via INTRAVENOUS
  Filled 2023-01-26: qty 20
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
## 2023-01-26 NOTE — Discharge Instructions (Signed)
You have a displaced fibular fracture and dislocation of your right ankle.  I have given you 30 mg of Toradol IM here.  Do not have anything to eat or drink until your ER evaluation is complete.  I am sending you down for specialty consultation and reduction of your ankle dislocation.
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
## 2023-01-26 NOTE — Progress Notes (Signed)
Orthopedic Tech Progress Note Patient Details:  Jimmy Zimmerman Aug 21, 1966 [id number]  Posterior short leg and stirrup placed on RLE for transportation to the ED from urgent care.   Ortho Devices Type of Ortho Device: Post (short leg) splint, Stirrup splint Ortho Device/Splint Location: RLE Ortho Device/Splint Interventions: Ordered, Application, Adjustment   Post Interventions Patient Tolerated: Well Instructions Provided: Care of device  Trea Latner Carmine Savoy 01/26/2023, 9:05 PM

## 2023-01-26 NOTE — ED Notes (Signed)
Patient has a friend who we located in the parking lot in the room with him now to help transport him down to the ED after ortho places splint

## 2023-01-26 NOTE — Progress Notes (Addendum)
Orthopedic Tech Progress Note Patient Details:  Jimmy Zimmerman 1966/06/01 [id number] Assisted ED MD with splint. Ortho Devices Type of Ortho Device: Short leg splint, Stirrup splint, Crutches Ortho Device/Splint Location: RLE Ortho Device/Splint Interventions: Ordered, Application, Adjustment   Post Interventions Patient Tolerated: Well Instructions Provided: Adjustment of device, Care of device, Poper ambulation with device  Selim Durden L Pearlee Arvizu 01/26/2023, 11:58 PM

## 2023-01-26 NOTE — ED Notes (Signed)
Ortho in room at this time.

## 2023-01-26 NOTE — ED Notes (Signed)
RRT at bedside for the procedural sedation of right ankle deformity. No respiratory or hemodynamic compromise noted. MD/PA at bedside throughout procedure. Jerime tolerated it well no complications noted.

## 2023-01-26 NOTE — ED Notes (Signed)
Ortho tech called 
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
## 2023-01-26 NOTE — ED Provider Notes (Signed)
.  Sedation  Date/Time: 01/26/2023 11:50 PM  Performed by: Pricilla Loveless, MD Authorized by: Pricilla Loveless, MD   Consent:    Consent obtained:  Verbal and written   Consent given by:  Patient Universal protocol:    Immediately prior to procedure, a time out was called: yes     Patient identity confirmed:  Verbally with patient Indications:    Procedure performed:  Fracture reduction   Procedure necessitating sedation performed by:  Different physician Pre-sedation assessment:    Time since last food or drink:  4 hours   NPO status caution: urgency dictates proceeding with non-ideal NPO status     ASA classification: class 2 - patient with mild systemic disease     Mallampati score:  I - soft palate, uvula, fauces, pillars visible   Pre-sedation assessments completed and reviewed: airway patency, cardiovascular function, hydration status, mental status, nausea/vomiting, pain level, respiratory function and temperature   Immediate pre-procedure details:    Reviewed: vital signs, relevant labs/tests and NPO status     Verified: bag valve mask available, emergency equipment available, intubation equipment available, IV patency confirmed, oxygen available and suction available   Procedure details (see MAR for exact dosages):    Preoxygenation:  Nasal cannula   Sedation:  Propofol   Intended level of sedation: deep   Intra-procedure monitoring:  Blood pressure monitoring, cardiac monitor, continuous pulse oximetry, continuous capnometry, frequent LOC assessments and frequent vital sign checks   Intra-procedure events: none     Total Provider sedation time (minutes):  15 Post-procedure details:    Attendance: Constant attendance by certified staff until patient recovered     Recovery: Patient returned to pre-procedure baseline     Patient is stable for discharge or admission: yes     Procedure completion:  Tolerated well, no immediate complications     Pricilla Loveless, MD 01/26/23  2352

## 2023-01-27 MED ORDER — TETANUS-DIPHTH-ACELL PERTUSSIS 5-2.5-18.5 LF-MCG/0.5 IM SUSY
0.5000 mL | PREFILLED_SYRINGE | Freq: Once | INTRAMUSCULAR | Status: AC
  Administered 2023-01-27: 0.5 mL via INTRAMUSCULAR
  Filled 2023-01-27: qty 0.5

## 2023-01-27 MED ORDER — OXYCODONE-ACETAMINOPHEN 5-325 MG PO TABS: 1.0000 | ORAL_TABLET | Freq: Four times a day (QID) | ORAL | 0 refills | Status: AC | PRN

## 2023-01-27 MED ORDER — OXYCODONE-ACETAMINOPHEN 5-325 MG PO TABS
1.0000 | ORAL_TABLET | Freq: Once | ORAL | Status: AC
  Administered 2023-01-27: 1 via ORAL
  Filled 2023-01-27: qty 1

## 2023-01-27 NOTE — Discharge Instructions (Addendum)
You have been evaluated for your right ankle injury.  Your right ankle has been reduced but please call and follow-up closely with orthopedic specialist tomorrow for outpatient management of your injury.  You have a cut on your left eyebrow that was cleaned and sterile glue was applied.  This will serve as a scab and it will fall off in 3 to 5 days.  Monitor for any signs of infection.  Take pain medication as needed but be aware that it can cause drowsiness.  Use crutches to ambulate.

## 2023-01-27 NOTE — ED Provider Notes (Signed)
Received at shift change from [name], [facility] please see note for full detail  In short patient with hypertension presenting from urgent care due to injury to his right ankle, imagings were performed at urgent care shows that he had a displaced fracture of the distal fibula with lateral dislocation of the talus.  He was sent here for reduction, prior provider and attending reduced displacement was placed in a splint.  Plan was to reassess patient today is back to his baseline.  On my evaluation patient is resting comfortably bed eating a bag of chips, states that he feels fine has no complaints, patient has 2+ dorsal pedal pulse in the right foot, 2-second capillary refill, sensation intact to light touch, able to wiggle his toes, vital signs were reassuring.  States he feels comfortable discharge.  Will discharge patient home per previous providers instructions.     Jimmy Sage, PA-C 01/27/23 1610    Tilden Fossa, MD 01/27/23 [phone]

## 2023-02-01 ENCOUNTER — Other Ambulatory Visit: Payer: Self-pay | Admitting: Orthopaedic Surgery

## 2023-02-01 DIAGNOSIS — S8261XA Displaced fracture of lateral malleolus of right fibula, initial encounter for closed fracture: Secondary | ICD-10-CM

## 2023-02-02 ENCOUNTER — Ambulatory Visit
Admission: RE | Admit: 2023-02-02 | Discharge: 2023-02-02 | Disposition: A | Discharge status: Home / Self Care | Payer: Medicaid Other | Source: Ambulatory Visit | Attending: Orthopaedic Surgery | Admitting: Orthopaedic Surgery

## 2023-02-02 DIAGNOSIS — S8261XA Displaced fracture of lateral malleolus of right fibula, initial encounter for closed fracture: Secondary | ICD-10-CM

## 2023-02-06 ENCOUNTER — Other Ambulatory Visit: Payer: Self-pay

## 2023-02-06 ENCOUNTER — Other Ambulatory Visit: Payer: Self-pay | Admitting: Family Medicine

## 2023-02-06 NOTE — H&P (Signed)
ORTHOPAEDIC SURGERY H&P  Subjective:  The patient presents with right ankle fx.   Past Medical History:  Diagnosis Date   Hypertension    Pancreatitis     Past Surgical History:  Procedure Laterality Date   HEMORRHOID SURGERY     right knee surgery        (Not in an outpatient encounter)    No Known Allergies  Social History   Socioeconomic History   Marital status: Single    Spouse name: Not on file   Number of children: Not on file   Years of education: Not on file   Highest education level: Not on file  Occupational History   Not on file  Tobacco Use   Smoking status: Every Day    Packs/day: 0.50    Years: 30.00    Additional pack years: 0.00    Total pack years: 15.00    Types: Cigarettes   Smokeless tobacco: Never  Vaping Use   Vaping Use: Never used  Substance and Sexual Activity   Alcohol use: Yes    Alcohol/week: 6.0 standard drinks of alcohol    Types: 6 Cans of beer per week    Comment: everyday    Drug use: No   Sexual activity: Not on file  Other Topics Concern   Not on file  Social History Narrative   Not on file   Social Determinants of Health   Financial Resource Strain: Not on file  Food Insecurity: Not on file  Transportation Needs: Not on file  Physical Activity: Not on file  Stress: Not on file  Social Connections: Not on file  Intimate Partner Violence: Not on file     History reviewed. No pertinent family history.   Review of Systems Pertinent items are noted in HPI.  Objective: Vital signs in last 24 hours:    01/27/2023   12:15 AM 01/26/2023   10:42 PM 01/26/2023    9:17 PM  Vitals with BMI  Height  5\' 7"    Weight  165 lbs 160 lbs  BMI  25.84   Systolic 144    Diastolic 79    Pulse 80        EXAM: General: Well nourished, well developed. Awake, alert and oriented to time, place, person. Normal mood and affect. No apparent distress. Breathing room air.  Operative Lower Extremity: Alignment -  Neutral Deformity - None Skin intact Tenderness to palpation - right ankle 5/5 TA, PT, GS, Per, EHL, FHL Sensation intact to light touch throughout Palpable DP and PT pulses Special testing: None  The contralateral foot/ankle was examined for comparison and noted to be neurovascularly intact with no localized deformity, swelling, or tenderness.  Imaging Review All images taken were independently reviewed by me.  Assessment/Plan: The clinical and radiographic findings were reviewed and discussed at length with the patient.  The patient has right ankle fx.  We spoke at length about the natural course of these findings. We discussed nonoperative and operative treatment options in detail.  The risks and benefits were presented and reviewed. The risks due to implant failure/irritation, infection, stiffness, nerve/vessel/tendon injury, wound healing issues, failure of this surgery, need for further surgery, thromboembolic events, amputation, death among others were discussed. The patient acknowledged the explanation and agreed to proceed with the plan.  Netta Cedars  Orthopaedic Surgery [facility]

## 2023-02-06 NOTE — Discharge Instructions (Signed)
Deepak Ramanathan, MD [facility]  Please read the following information regarding your care after surgery.  Medications  You only need a prescription for the narcotic pain medicine (ex. oxycodone, Percocet, Norco).  All of the other medicines listed below are available over the counter. ? Aleve 2 pills twice a day for the first 3 days after surgery. ? acetominophen (Tylenol) 650 mg every 4-6 hours as you need for minor to moderate pain ? oxycodone as prescribed for severe pain  ? To help prevent blood clots, take aspirin (81 mg) twice daily for 42 days after surgery (or total duration of nonweightbearing).  You should also get up every hour while you are awake to move around.  Weight Bearing ? Do NOT bear any weight on the operated leg or foot. This means do NOT touch your surgical leg to the ground!  Cast / Splint / Dressing ? If you have a splint, do NOT remove this. Keep your splint, cast or dressing clean and dry.  Don't put anything (coat hanger, pencil, etc) down inside of it.  If it gets wet, call the office immediately to schedule an appointment for a cast change.  Swelling IMPORTANT: It is normal for you to have swelling where you had surgery. To reduce swelling and pain, keep at least 3 pillows under your leg so that your toes are above your nose and your heel is above the level of your hip.  It may be necessary to keep your foot or leg elevated for several weeks.  This is critical to helping your incisions heal and your pain to feel better.  Follow Up Call my office at [phone] when you are discharged from the hospital or surgery center to schedule an appointment to be seen 7-10 days after surgery.  Call my office at [phone] if you develop a fever >101.5 F, nausea, vomiting, bleeding from the surgical site or severe pain.     

## 2023-02-07 ENCOUNTER — Encounter (HOSPITAL_COMMUNITY): Payer: Self-pay

## 2023-02-07 ENCOUNTER — Other Ambulatory Visit: Payer: Self-pay

## 2023-02-07 ENCOUNTER — Encounter (HOSPITAL_COMMUNITY)
Admission: RE | Admit: 2023-02-07 | Discharge: 2023-02-07 | Disposition: A | Discharge status: Home / Self Care | Payer: BLUE CROSS/BLUE SHIELD | Source: Ambulatory Visit | Attending: Orthopaedic Surgery | Admitting: Orthopaedic Surgery

## 2023-02-07 VITALS — BP 127/81 | HR 92 | Temp 98.2°F | Resp 18 | Ht 67.0 in | Wt 163.1 lb

## 2023-02-07 DIAGNOSIS — E8729 Other acidosis: Secondary | ICD-10-CM

## 2023-02-07 DIAGNOSIS — I10 Essential (primary) hypertension: Secondary | ICD-10-CM | POA: Insufficient documentation

## 2023-02-07 DIAGNOSIS — Z01818 Encounter for other preprocedural examination: Secondary | ICD-10-CM | POA: Diagnosis not present

## 2023-02-07 DIAGNOSIS — F1910 Other psychoactive substance abuse, uncomplicated: Secondary | ICD-10-CM | POA: Insufficient documentation

## 2023-02-07 DIAGNOSIS — F17210 Nicotine dependence, cigarettes, uncomplicated: Secondary | ICD-10-CM | POA: Diagnosis not present

## 2023-02-07 DIAGNOSIS — F1010 Alcohol abuse, uncomplicated: Secondary | ICD-10-CM | POA: Insufficient documentation

## 2023-02-07 DIAGNOSIS — X58XXXA Exposure to other specified factors, initial encounter: Secondary | ICD-10-CM | POA: Diagnosis not present

## 2023-02-07 DIAGNOSIS — S8261XA Displaced fracture of lateral malleolus of right fibula, initial encounter for closed fracture: Secondary | ICD-10-CM | POA: Diagnosis not present

## 2023-02-07 DIAGNOSIS — S82841A Displaced bimalleolar fracture of right lower leg, initial encounter for closed fracture: Secondary | ICD-10-CM | POA: Diagnosis present

## 2023-02-07 HISTORY — DX: Other psychoactive substance abuse, uncomplicated: F19.10

## 2023-02-07 LAB — COMPREHENSIVE METABOLIC PANEL
ALT: 18 U/L (ref 0–44)
AST: 23 U/L (ref 15–41)
Albumin: 4.3 g/dL (ref 3.5–5.0)
Alkaline Phosphatase: 56 U/L (ref 38–126)
Anion gap: 11 (ref 5–15)
BUN: 7 mg/dL (ref 6–20)
CO2: 27 mmol/L (ref 22–32)
Calcium: 9.8 mg/dL (ref 8.9–10.3)
Chloride: 94 mmol/L — ABNORMAL LOW (ref 98–111)
Creatinine, Ser: 0.76 mg/dL (ref 0.61–1.24)
GFR, Estimated: >60 mL/min (ref >60)
Glucose, Bld: 88 mg/dL (ref 70–99)
Potassium: 3.4 mmol/L — ABNORMAL LOW (ref 3.5–5.1)
Sodium: 132 mmol/L — ABNORMAL LOW (ref 135–145)
Total Bilirubin: 1.1 mg/dL (ref 0.3–1.2)
Total Protein: 7.9 g/dL (ref 6.5–8.1)

## 2023-02-07 LAB — CBC
HCT: 43.3 % (ref 39.0–52.0)
Hemoglobin: 15.5 g/dL (ref 13.0–17.0)
MCH: 33 pg (ref 26.0–34.0)
MCHC: 35.8 g/dL (ref 30.0–36.0)
MCV: 92.1 fL (ref 80.0–100.0)
Platelets: 387 10*3/uL (ref 150–400)
RBC: 4.7 MIL/uL (ref 4.22–5.81)
RDW: 14.6 % (ref 11.5–15.5)
WBC: 10 10*3/uL (ref 4.0–10.5)
nRBC: 0 % (ref 0.0–0.2)

## 2023-02-07 NOTE — Patient Instructions (Addendum)
SURGICAL WAITING ROOM VISITATION Patients having surgery or a procedure may have no more than 2 support people in the waiting area - these visitors may rotate in the visitor waiting room.   Due to an increase in RSV and influenza rates and associated hospitalizations, children ages 37 and under may not visit patients in [facility] hospitals. If the patient needs to stay at the hospital during part of their recovery, the visitor guidelines for inpatient rooms apply.  PRE-OP VISITATION  Pre-op nurse will coordinate an appropriate time for 1 support person to accompany the patient in pre-op.  This support person may not rotate.  This visitor will be contacted when the time is appropriate for the visitor to come back in the pre-op area.  Please refer to the [facility] website for the visitor guidelines for Inpatients (after your surgery is over and you are in a regular room).  You are not required to quarantine at this time prior to your surgery. However, you must do this: Hand Hygiene often Do NOT share personal items Notify your provider if you are in close contact with someone who has COVID or you develop fever 100.4 or greater, new onset of sneezing, cough, sore throat, shortness of breath or body aches.  If you test positive for Covid or have been in contact with anyone that has tested positive in the last 10 days please notify you surgeon.    Your procedure is scheduled on:  Wednseday  February 08, 2023  Report to [facility] Main Entrance: [place] entrance where the [employer] is available.   Report to admitting at: 11:00    AM  +++++Call this number if you have any questions or problems the morning of surgery [phone]  DO NOT EAT OR DRINK ANYTHING AFTER MIDNIGHT THE NIGHT PRIOR TO YOUR SURGERY / PROCEDURE.   FOLLOW BOWEL PREP AND ANY ADDITIONAL PRE OP INSTRUCTIONS YOU RECEIVED FROM YOUR SURGEON'S OFFICE!!!   Oral Hygiene is also important to reduce your risk of  infection.        Remember - BRUSH YOUR TEETH THE MORNING OF SURGERY WITH YOUR REGULAR TOOTHPASTE   Do NOT smoke after Midnight the night before surgery.   Take ONLY these medicines the morning of surgery with A SIP OF WATER: Tylenol for pain if needed.                    You may not have any metal on your body including, jewelry, and body piercing  Do not wear lotions, powders,  cologne, or deodorant  Men may shave face and neck.  Contacts, Hearing Aids, dentures or bridgework may not be worn into surgery. DENTURES WILL BE REMOVED PRIOR TO SURGERY PLEASE DO NOT APPLY "Poly grip" OR ADHESIVES!!!   Patients discharged on the day of surgery will not be allowed to drive home.  Someone NEEDS to stay with you for the first 24 hours after anesthesia.  Do not bring your home medications to the hospital. The Pharmacy will dispense medications listed on your medication list to you during your admission in the Hospital.  Please read over the following fact sheets you were given: IF YOU HAVE QUESTIONS ABOUT YOUR PRE-OP INSTRUCTIONS, PLEASE CALL [phone].   [facility] - Preparing for Surgery Before surgery, you can play an important role.  Because skin is not sterile, your skin needs to be as free of germs as possible.  You can reduce the number of germs on your  skin by washing with CHG (chlorahexidine gluconate) soap before surgery.  CHG is an antiseptic cleaner which kills germs and bonds with the skin to continue killing germs even after washing. Please DO NOT use if you have an allergy to CHG or antibacterial soaps.  If your skin becomes reddened/irritated stop using the CHG and inform your nurse when you arrive at Short Stay. Do not shave (including legs and underarms) for at least 48 hours prior to the first CHG shower.  You may shave your face/neck.  Please follow these instructions carefully:  1.  Shower with CHG Soap the night before surgery and the  morning of surgery.  2.  If you  choose to wash your hair, wash your hair first as usual with your normal  shampoo.  3.  After you shampoo, rinse your hair and body thoroughly to remove the shampoo.                             4.  Use CHG as you would any other liquid soap.  You can apply chg directly to the skin and wash.  Gently with a scrungie or clean washcloth.  5.  Apply the CHG Soap to your body ONLY FROM THE NECK DOWN.   Do not use on face/ open                           Wound or open sores. Avoid contact with eyes, ears mouth and genitals (private parts).                       Wash face,  Genitals (private parts) with your normal soap.             6.  Wash thoroughly, paying special attention to the area where your  surgery  will be performed.  7.  Thoroughly rinse your body with warm water from the neck down.  8.  DO NOT shower/wash with your normal soap after using and rinsing off the CHG Soap.            9.  Pat yourself dry with a clean towel.            10.  Wear clean pajamas.            11.  Place clean sheets on your bed the night of your first shower and do not  sleep with pets.  ON THE DAY OF SURGERY : Do not apply any lotions/deodorants the morning of surgery.  Please wear clean clothes to the hospital/surgery center.    FAILURE TO FOLLOW THESE INSTRUCTIONS MAY RESULT IN THE CANCELLATION OF YOUR SURGERY  PATIENT SIGNATURE_________________________________  NURSE SIGNATURE__________________________________  ________________________________________________________________________

## 2023-02-07 NOTE — Progress Notes (Addendum)
COVID Vaccine received:  []  No [x]  Yes Date of any COVID positive Test in last 90 days:  None  PCP - Georganna Skeans, MD Cardiologist - none  Chest x-ray - 12-22-2014  2v Epic EKG -  12-23-2014 Epic   Will repeat at PST Stress Test -  ECHO -  Cardiac Cath -   PCR screen: []  Ordered & Completed           []   No Order but Needs PROFEND           [x]   N/A for this surgery  Surgery Plan:  [x]  Ambulatory                            []  Outpatient in bed                            []  Admit  Anesthesia:    []  General  []  Spinal                           [x]   Choice []   MAC  Pacemaker / ICD device [x]  No []  Yes   Spinal Cord Stimulator:[x]  No []  Yes       History of Sleep Apnea? [x]  No []  Yes   CPAP used?- [x]  No []  Yes    Does the patient monitor blood sugar?          []  No []  Yes  [x]  N/A  Patient has: [x]  NO Hx DM   []  Pre-DM                 []  DM1  []   DM2  Blood Thinner / Instructions: none Aspirin Instructions:  none  ERAS Protocol Ordered: [x]  No  []  Yes Patient is to be NPO after: Midnight prior   Activity level: Patient is unable to climb a flight of stairs without difficulty; [x]  No CP  [x]  No SOB, but would have Leg pain. Patient can perform ADLs without assistance.   Anesthesia review: Smoker, ETOH abuse, pancreatitis, HTN (not on his meds right now because he ran out. PCP wants him to come in to be seen but he doesn't want an appt)  Patient denies shortness of breath, fever, cough and chest pain at PAT appointment.  Patient verbalized understanding and agreement to the Pre-Surgical Instructions that were given to them at this PAT appointment. Patient was also educated of the need to review these PAT instructions again prior to his surgery.I reviewed the appropriate phone numbers to call if they have any and questions or concerns.

## 2023-02-08 ENCOUNTER — Ambulatory Visit (HOSPITAL_COMMUNITY)
Admission: RE | Admit: 2023-02-08 | Discharge: 2023-02-08 | Disposition: A | Discharge status: Home / Self Care | Payer: BLUE CROSS/BLUE SHIELD | Attending: Orthopaedic Surgery | Admitting: Orthopaedic Surgery

## 2023-02-08 ENCOUNTER — Encounter (HOSPITAL_COMMUNITY): Payer: Self-pay | Admitting: Orthopaedic Surgery

## 2023-02-08 ENCOUNTER — Ambulatory Visit (HOSPITAL_COMMUNITY): Payer: BLUE CROSS/BLUE SHIELD

## 2023-02-08 ENCOUNTER — Other Ambulatory Visit: Payer: Self-pay

## 2023-02-08 ENCOUNTER — Ambulatory Visit (HOSPITAL_BASED_OUTPATIENT_CLINIC_OR_DEPARTMENT_OTHER): Payer: BLUE CROSS/BLUE SHIELD | Admitting: Anesthesiology

## 2023-02-08 ENCOUNTER — Ambulatory Visit (HOSPITAL_COMMUNITY): Payer: BLUE CROSS/BLUE SHIELD | Admitting: Anesthesiology

## 2023-02-08 ENCOUNTER — Other Ambulatory Visit (HOSPITAL_COMMUNITY): Payer: Self-pay

## 2023-02-08 ENCOUNTER — Encounter (HOSPITAL_COMMUNITY)
Admission: RE | Disposition: A | Discharge status: Home / Self Care | Payer: Self-pay | Source: Home / Self Care | Attending: Orthopaedic Surgery

## 2023-02-08 DIAGNOSIS — F1010 Alcohol abuse, uncomplicated: Secondary | ICD-10-CM

## 2023-02-08 DIAGNOSIS — S82841D Displaced bimalleolar fracture of right lower leg, subsequent encounter for closed fracture with routine healing: Secondary | ICD-10-CM | POA: Diagnosis not present

## 2023-02-08 DIAGNOSIS — Z8719 Personal history of other diseases of the digestive system: Secondary | ICD-10-CM

## 2023-02-08 DIAGNOSIS — I10 Essential (primary) hypertension: Secondary | ICD-10-CM

## 2023-02-08 DIAGNOSIS — F1910 Other psychoactive substance abuse, uncomplicated: Secondary | ICD-10-CM

## 2023-02-08 DIAGNOSIS — X58XXXA Exposure to other specified factors, initial encounter: Secondary | ICD-10-CM | POA: Insufficient documentation

## 2023-02-08 DIAGNOSIS — S82841A Displaced bimalleolar fracture of right lower leg, initial encounter for closed fracture: Secondary | ICD-10-CM | POA: Insufficient documentation

## 2023-02-08 DIAGNOSIS — F17210 Nicotine dependence, cigarettes, uncomplicated: Secondary | ICD-10-CM | POA: Insufficient documentation

## 2023-02-08 HISTORY — PX: ORIF ANKLE FRACTURE: SHX5408

## 2023-02-08 SURGERY — OPEN REDUCTION INTERNAL FIXATION (ORIF) ANKLE FRACTURE
Anesthesia: General | Site: Ankle | Laterality: Right

## 2023-02-08 MED ORDER — OXYCODONE HCL 5 MG PO TABS
5.0000 mg | ORAL_TABLET | Freq: Once | ORAL | Status: AC | PRN
  Administered 2023-02-08: 5 mg via ORAL

## 2023-02-08 MED ORDER — MIDAZOLAM HCL 2 MG/2ML IJ SOLN: 0.5000 mg | Freq: Once | INTRAMUSCULAR | Status: DC | PRN

## 2023-02-08 MED ORDER — MEPERIDINE HCL 50 MG/ML IJ SOLN: 6.2500 mg | INTRAMUSCULAR | Status: DC | PRN

## 2023-02-08 MED ORDER — ACETAMINOPHEN 500 MG PO TABS: 1000.0000 mg | ORAL_TABLET | Freq: Once | ORAL | Status: DC

## 2023-02-08 MED ORDER — LACTATED RINGERS IV SOLN: INTRAVENOUS | Status: DC

## 2023-02-08 MED ORDER — EPHEDRINE SULFATE (PRESSORS) 50 MG/ML IJ SOLN
INTRAMUSCULAR | Status: DC | PRN
  Administered 2023-02-08: 10 mg via INTRAVENOUS

## 2023-02-08 MED ORDER — OXYCODONE HCL 5 MG PO TABS
ORAL_TABLET | ORAL | Status: AC
  Filled 2023-02-08: qty 1

## 2023-02-08 MED ORDER — VANCOMYCIN HCL 1000 MG IV SOLR
INTRAVENOUS | Status: DC | PRN
  Administered 2023-02-08: 1000 mg via TOPICAL

## 2023-02-08 MED ORDER — DEXAMETHASONE SODIUM PHOSPHATE 10 MG/ML IJ SOLN
INTRAMUSCULAR | Status: DC | PRN
  Administered 2023-02-08: 8 mg via INTRAVENOUS

## 2023-02-08 MED ORDER — PROPOFOL 10 MG/ML IV BOLUS
INTRAVENOUS | Status: AC
  Filled 2023-02-08: qty 20

## 2023-02-08 MED ORDER — LIDOCAINE 2% (20 MG/ML) 5 ML SYRINGE
INTRAMUSCULAR | Status: DC | PRN
  Administered 2023-02-08: 60 mg via INTRAVENOUS

## 2023-02-08 MED ORDER — KETOROLAC TROMETHAMINE 30 MG/ML IJ SOLN
INTRAMUSCULAR | Status: AC
  Filled 2023-02-08: qty 1

## 2023-02-08 MED ORDER — CHLORHEXIDINE GLUCONATE 0.12 % MT SOLN
15.0000 mL | Freq: Once | OROMUCOSAL | Status: AC
  Administered 2023-02-08: 15 mL via OROMUCOSAL

## 2023-02-08 MED ORDER — FENTANYL CITRATE (PF) 100 MCG/2ML IJ SOLN
INTRAMUSCULAR | Status: AC
  Filled 2023-02-08: qty 2

## 2023-02-08 MED ORDER — PROMETHAZINE HCL 25 MG/ML IJ SOLN: 6.2500 mg | INTRAMUSCULAR | Status: DC | PRN

## 2023-02-08 MED ORDER — HYDROMORPHONE HCL 1 MG/ML IJ SOLN: 0.2500 mg | INTRAMUSCULAR | Status: DC | PRN

## 2023-02-08 MED ORDER — MIDAZOLAM HCL 2 MG/2ML IJ SOLN
2.0000 mg | Freq: Once | INTRAMUSCULAR | Status: AC
  Administered 2023-02-08: 2 mg via INTRAVENOUS
  Filled 2023-02-08: qty 2

## 2023-02-08 MED ORDER — ORAL CARE MOUTH RINSE: 15.0000 mL | Freq: Once | OROMUCOSAL | Status: AC

## 2023-02-08 MED ORDER — ONDANSETRON HCL 4 MG/2ML IJ SOLN
INTRAMUSCULAR | Status: AC
  Filled 2023-02-08: qty 2

## 2023-02-08 MED ORDER — FENTANYL CITRATE PF 50 MCG/ML IJ SOSY
100.0000 ug | PREFILLED_SYRINGE | Freq: Once | INTRAMUSCULAR | Status: AC
  Administered 2023-02-08: 100 ug via INTRAVENOUS
  Filled 2023-02-08: qty 2

## 2023-02-08 MED ORDER — CEFAZOLIN SODIUM-DEXTROSE 2-4 GM/100ML-% IV SOLN
2.0000 g | INTRAVENOUS | Status: AC
  Administered 2023-02-08: 2 g via INTRAVENOUS
  Filled 2023-02-08: qty 100

## 2023-02-08 MED ORDER — EPHEDRINE 5 MG/ML INJ
INTRAVENOUS | Status: AC
  Filled 2023-02-08: qty 5

## 2023-02-08 MED ORDER — MIDAZOLAM HCL 2 MG/2ML IJ SOLN
INTRAMUSCULAR | Status: AC
  Filled 2023-02-08: qty 2

## 2023-02-08 MED ORDER — 0.9 % SODIUM CHLORIDE (POUR BTL) OPTIME
TOPICAL | Status: DC | PRN
  Administered 2023-02-08: 1000 mL

## 2023-02-08 MED ORDER — VANCOMYCIN HCL 1000 MG IV SOLR
INTRAVENOUS | Status: AC
  Filled 2023-02-08: qty 20

## 2023-02-08 MED ORDER — CHLORHEXIDINE GLUCONATE 4 % EX SOLN: 60.0000 mL | Freq: Once | CUTANEOUS | Status: DC

## 2023-02-08 MED ORDER — DEXAMETHASONE SODIUM PHOSPHATE 10 MG/ML IJ SOLN
INTRAMUSCULAR | Status: AC
  Filled 2023-02-08: qty 1

## 2023-02-08 MED ORDER — OXYCODONE HCL 5 MG/5ML PO SOLN: 5.0000 mg | Freq: Once | ORAL | Status: AC | PRN

## 2023-02-08 MED ORDER — BUPIVACAINE-EPINEPHRINE (PF) 0.5% -1:200000 IJ SOLN
INTRAMUSCULAR | Status: DC | PRN
  Administered 2023-02-08: 30 mL via PERINEURAL
  Administered 2023-02-08: 20 mL via PERINEURAL

## 2023-02-08 MED ORDER — LIDOCAINE HCL (PF) 2 % IJ SOLN
INTRAMUSCULAR | Status: AC
  Filled 2023-02-08: qty 5

## 2023-02-08 MED ORDER — PROPOFOL 10 MG/ML IV BOLUS
INTRAVENOUS | Status: DC | PRN
  Administered 2023-02-08: 200 mg via INTRAVENOUS

## 2023-02-08 MED ORDER — ONDANSETRON HCL 4 MG/2ML IJ SOLN
INTRAMUSCULAR | Status: DC | PRN
  Administered 2023-02-08: 4 mg via INTRAVENOUS

## 2023-02-08 SURGICAL SUPPLY — 63 items
APL PRP STRL LF DISP 70% ISPRP (MISCELLANEOUS) ×2
BAG COUNTER SPONGE SURGICOUNT (BAG) IMPLANT
BAG SPEC THK2 15X12 ZIP CLS (MISCELLANEOUS) ×1
BAG SPNG CNTER NS LX DISP (BAG)
BAG ZIPLOCK 12X15 (MISCELLANEOUS) ×2 IMPLANT
BANDAGE ESMARK 6X9 LF (GAUZE/BANDAGES/DRESSINGS) ×2 IMPLANT
BIT DRILL 2.5X2.75 QC CALB (BIT) IMPLANT
BLADE SURG 15 STRL LF DISP TIS (BLADE) ×4 IMPLANT
BLADE SURG 15 STRL SS (BLADE) ×2
BNDG CMPR 5X4 KNIT ELC UNQ LF (GAUZE/BANDAGES/DRESSINGS) ×1
BNDG CMPR 5X6 CHSV STRCH STRL (GAUZE/BANDAGES/DRESSINGS) ×1
BNDG CMPR 5X62 HK CLSR LF (GAUZE/BANDAGES/DRESSINGS) ×1
BNDG CMPR 6 X 5 YARDS HK CLSR (GAUZE/BANDAGES/DRESSINGS) ×1
BNDG CMPR 6"X 5 YARDS HK CLSR (GAUZE/BANDAGES/DRESSINGS) ×1
BNDG CMPR 9X6 STRL LF SNTH (GAUZE/BANDAGES/DRESSINGS) ×1
BNDG CMPR MD 5X2 ELC HKLP STRL (GAUZE/BANDAGES/DRESSINGS) ×1
BNDG CMPR MED 10X6 ELC LF (GAUZE/BANDAGES/DRESSINGS) ×1
BNDG COHESIVE 6X5 TAN ST LF (GAUZE/BANDAGES/DRESSINGS) ×2 IMPLANT
BNDG ELASTIC 2 VLCR STRL LF (GAUZE/BANDAGES/DRESSINGS) IMPLANT
BNDG ELASTIC 4INX 5YD STR LF (GAUZE/BANDAGES/DRESSINGS) ×2 IMPLANT
BNDG ELASTIC 6INX 5YD STR LF (GAUZE/BANDAGES/DRESSINGS) ×2 IMPLANT
BNDG ELASTIC 6X10 VLCR STRL LF (GAUZE/BANDAGES/DRESSINGS) IMPLANT
BNDG ESMARK 6X9 LF (GAUZE/BANDAGES/DRESSINGS) ×1
BNDG GAUZE DERMACEA FLUFF 4 (GAUZE/BANDAGES/DRESSINGS) ×2 IMPLANT
BNDG GZE DERMACEA 4 6PLY (GAUZE/BANDAGES/DRESSINGS) ×1
CHLORAPREP W/TINT 26 (MISCELLANEOUS) ×4 IMPLANT
COVER SURGICAL LIGHT HANDLE (MISCELLANEOUS) ×2 IMPLANT
CUFF TOURN SGL QUICK 34 (TOURNIQUET CUFF) ×1
CUFF TRNQT CYL 34X4.125X (TOURNIQUET CUFF) ×2 IMPLANT
DRAPE C-ARM 42X120 X-RAY (DRAPES) IMPLANT
DRAPE C-ARMOR (DRAPES) ×2 IMPLANT
DRAPE EXTREMITY T 121X128X90 (DISPOSABLE) ×2 IMPLANT
DRAPE U-SHAPE 47X51 STRL (DRAPES) ×2 IMPLANT
ELECT REM PT RETURN 15FT ADLT (MISCELLANEOUS) ×2 IMPLANT
FIXATION ZIPTIGHT ANKLE SNDSMS (Ankle) IMPLANT
GAUZE 4X4 16PLY ~~LOC~~+RFID DBL (SPONGE) ×2 IMPLANT
GAUZE PAD ABD 8X10 STRL (GAUZE/BANDAGES/DRESSINGS) ×10 IMPLANT
GAUZE SPONGE 4X4 12PLY STRL (GAUZE/BANDAGES/DRESSINGS) ×4 IMPLANT
GAUZE XEROFORM 1X8 LF (GAUZE/BANDAGES/DRESSINGS) ×2 IMPLANT
GLOVE BIOGEL PI IND STRL 7.5 (GLOVE) ×4 IMPLANT
GLOVE INDICATOR 7.5 STRL GRN (GLOVE) ×2 IMPLANT
GOWN STRL REUS W/ TWL LRG LVL3 (GOWN DISPOSABLE) ×2 IMPLANT
GOWN STRL REUS W/TWL LRG LVL3 (GOWN DISPOSABLE) ×1
K-WIRE ACE 1.6X6 (WIRE) ×3
KIT BASIN OR (CUSTOM PROCEDURE TRAY) ×2 IMPLANT
KIT TURNOVER KIT A (KITS) IMPLANT
KWIRE ACE 1.6X6 (WIRE) IMPLANT
NS IRRIG 1000ML POUR BTL (IV SOLUTION) ×2 IMPLANT
PACK TOTAL JOINT (CUSTOM PROCEDURE TRAY) ×2 IMPLANT
PLATE LOCK 7H 92 BILAT FIB (Plate) IMPLANT
PROTECTOR NERVE ULNAR (MISCELLANEOUS) ×2 IMPLANT
SCREW LOCK 3.5X14 DIST TIB (Screw) IMPLANT
SCREW LOCK CORT STAR 3.5X12 (Screw) IMPLANT
SCREW LOW PROFILE 12MMX3.5MM (Screw) IMPLANT
STOCKINETTE 4X48 STRL (DRAPES) ×2 IMPLANT
SUCTION TUBE FRAZIER 10FR DISP (SUCTIONS) ×2 IMPLANT
SUT ETHILON 3 0 PS 1 (SUTURE) ×4 IMPLANT
SUT VIC AB 2-0 SH 27 (SUTURE) ×1
SUT VIC AB 2-0 SH 27XBRD (SUTURE) ×2 IMPLANT
SUT VIC AB 3-0 SH 27 (SUTURE) ×2
SUT VIC AB 3-0 SH 27X BRD (SUTURE) ×4 IMPLANT
WATER STERILE IRR 1000ML POUR (IV SOLUTION) ×2 IMPLANT
ZIPTIGHT ANKLE SYNODESMOSS FIX (Ankle) ×1 IMPLANT

## 2023-02-08 NOTE — Anesthesia Procedure Notes (Signed)
Anesthesia Regional Block: Popliteal block   Pre-Anesthetic Checklist: , timeout performed,  Correct Patient, Correct Site, Correct Laterality,  Correct Procedure, Correct Position, site marked,  Risks and benefits discussed,  Surgical consent,  Pre-op evaluation,  At surgeon's request and post-op pain management  Laterality: Right and Lower  Prep: chloraprep       Needles:  Injection technique: Single-shot  Needle Type: Echogenic Needle     Needle Length: 9cm  Needle Gauge: 21     Additional Needles:   Procedures:,,,, ultrasound used (permanent image in chart),,    Narrative:  Start time: 02/08/2023 12:03 PM End time: 02/08/2023 12:07 PM Injection made incrementally with aspirations every 5 mL.  Performed by: Personally  Anesthesiologist: Jairo Ben, MD  Additional Notes: Pt identified in Holding room.  Monitors applied. Working IV access confirmed. Timeout, Sterile prep R lateral distal femur/knee.  #21ga ECHOgenic Arrow block needle to sciatic nerve at split in pop fossa with US guidance.  30cc 0.5% Bupivacaine 1:200k epi injected incrementally after negative test dose.  Patient asymptomatic, VSS, no heme aspirated, tolerated well.   Sandford Craze, MD

## 2023-02-08 NOTE — Anesthesia Preprocedure Evaluation (Addendum)
Anesthesia Evaluation  Patient identified by MRN, date of birth, ID band Patient awake    Reviewed: Allergy & Precautions, NPO status , Patient's Chart, lab work & pertinent test results  History of Anesthesia Complications Negative for: history of anesthetic complications  Airway Mallampati: I  TM Distance: >3 FB Neck ROM: Full    Dental  (+) Missing, Poor Dentition, Chipped, Dental Advisory Given   Pulmonary Current SmokerPatient did not abstain from smoking.   breath sounds clear to auscultation       Cardiovascular hypertension (pt ran out of meds 3d ago), Pt. on medications (-) angina  Rhythm:Regular Rate:Normal     Neuro/Psych negative neurological ROS     GI/Hepatic negative GI ROS,,,(+)     substance abuse  alcohol useH/o pancreatitis   Endo/Other  negative endocrine ROS    Renal/GU negative Renal ROS     Musculoskeletal   Abdominal   Peds  Hematology   Anesthesia Other Findings   Reproductive/Obstetrics                             Anesthesia Physical Anesthesia Plan  ASA: 2  Anesthesia Plan: General   Post-op Pain Management: Regional block* and Tylenol PO (pre-op)*   Induction: Intravenous  PONV Risk Score and Plan: 1 and Ondansetron and Dexamethasone  Airway Management Planned: LMA  Additional Equipment: None  Intra-op Plan:   Post-operative Plan:   Informed Consent: I have reviewed the patients History and Physical, chart, labs and discussed the procedure including the risks, benefits and alternatives for the proposed anesthesia with the patient or authorized representative who has indicated his/her understanding and acceptance.     Dental advisory given  Plan Discussed with: CRNA and Surgeon  Anesthesia Plan Comments: (Plan routine monitors, GA with popliteal and adductor canal blocks for post op analgesia)        Anesthesia Quick Evaluation

## 2023-02-08 NOTE — Anesthesia Procedure Notes (Signed)
Procedure Name: LMA Insertion Date/Time: 02/08/2023 12:25 PM  Performed by: Johnette Abraham, CRNAPre-anesthesia Checklist: Patient identified, Emergency Drugs available, Suction available and Patient being monitored Patient Re-evaluated:Patient Re-evaluated prior to induction Oxygen Delivery Method: Circle System Utilized Preoxygenation: Pre-oxygenation with 100% oxygen Induction Type: IV induction Ventilation: Mask ventilation without difficulty LMA: LMA inserted LMA Size: 4.0 Number of attempts: 1 Airway Equipment and Method: Bite block Placement Confirmation: positive ETCO2 Tube secured with: Tape Dental Injury: Teeth and Oropharynx as per pre-operative assessment

## 2023-02-08 NOTE — Anesthesia Postprocedure Evaluation (Signed)
Anesthesia Post Note  Patient: Jimmy Zimmerman  Procedure(s) Performed: OPEN REDUCTION INTERNAL FIXATION (ORIF) distal fibual ANKLE FRACTURE and syndemosis (Right: Ankle)     Patient location during evaluation: PACU Anesthesia Type: General Level of consciousness: awake and alert, patient cooperative and oriented Pain management: pain level controlled Vital Signs Assessment: post-procedure vital signs reviewed and stable Respiratory status: spontaneous breathing, nonlabored ventilation and respiratory function stable Cardiovascular status: blood pressure returned to baseline and stable Postop Assessment: no apparent nausea or vomiting Anesthetic complications: no   No notable events documented.  Last Vitals:  Vitals:   02/08/23 1410 02/08/23 1425  BP:    Pulse: 83   Resp: 15   Temp:  36.6 C  SpO2: 100%     Last Pain:  Vitals:   02/08/23 1410  TempSrc:   PainSc: 4                  Shaquia Berkley,E. [place]

## 2023-02-08 NOTE — Anesthesia Procedure Notes (Addendum)
Anesthesia Regional Block: Adductor canal block   Pre-Anesthetic Checklist: , timeout performed,  Correct Patient, Correct Site, Correct Laterality,  Correct Procedure, Correct Position, site marked,  Risks and benefits discussed,  Surgical consent,  Pre-op evaluation,  At surgeon's request and post-op pain management  Laterality: Right and Lower  Prep: chloraprep       Needles:  Injection technique: Single-shot  Needle Type: Echogenic Needle     Needle Length: 9cm  Needle Gauge: 21     Additional Needles:   Procedures:,,,, ultrasound used (permanent image in chart),,    Narrative:  Start time: 02/08/2023 11:56 AM End time: 02/08/2023 12:02 PM Injection made incrementally with aspirations every 5 mL.  Performed by: Personally  Anesthesiologist: Jairo Ben, MD  Additional Notes: Pt identified in Holding room.  Monitors applied. Working IV access confirmed. Timeout, Sterile prep R thigh.  #21ga ECHOgenic Arrow block needle into adductor canal with US guidance.  20cc 0.5% Bupivacaine 1:200k epi injected incrementally after negative test dose.  Patient asymptomatic, VSS, no heme aspirated, tolerated well.   Sandford Craze, MD

## 2023-02-08 NOTE — Transfer of Care (Signed)
Immediate Anesthesia Transfer of Care Note  Patient: Jimmy Zimmerman  Procedure(s) Performed: OPEN REDUCTION INTERNAL FIXATION (ORIF) distal fibual ANKLE FRACTURE and syndemosis (Right: Ankle)  Patient Location: PACU  Anesthesia Type:GA combined with regional for post-op pain  Level of Consciousness: awake, alert , oriented, and patient cooperative  Airway & Oxygen Therapy: Patient Spontanous Breathing and Patient connected to nasal cannula oxygen  Post-op Assessment: Report given to RN and Post -op Vital signs reviewed and stable  Post vital signs: Reviewed and stable  Last Vitals:  Vitals Value Taken Time  BP 147/87 02/08/23 1345  Temp    Pulse 67 02/08/23 1348  Resp 13 02/08/23 1348  SpO2 99 % 02/08/23 1348  Vitals shown include unvalidated device data.  Last Pain:  Vitals:   02/08/23 1122  TempSrc: Oral  PainSc:          Complications: No notable events documented.

## 2023-02-08 NOTE — H&P (Signed)
H&P Update:  -History and Physical Reviewed  -Patient has been re-examined  -No change in the plan of care  -The risks and benefits were presented and reviewed. The risks due to hardware/suture failure and/or irritation, new/persistent infection, stiffness, nerve/vessel/tendon injury or rerupture of repaired tendon, nonunion/malunion, allograft usage, wound healing issues, development of arthritis, failure of this surgery, possibility of external fixation with delayed definitive surgery, need for further surgery, thromboembolic events, anesthesia/medical complications, amputation, death among others were discussed. The patient acknowledged the explanation, agreed to proceed with the plan and a consent was signed.  Jimmy Zimmerman  

## 2023-02-08 NOTE — Op Note (Addendum)
02/08/2023  5:32 PM   PATIENT: Jimmy Zimmerman  57 y.o. male  MRN: [id number]   PRE-OPERATIVE DIAGNOSIS:   Closed bimalleolar equivalent fracture of right ankle   POST-OPERATIVE DIAGNOSIS:   Same   PROCEDURE: 1] Right ankle open reduction internal fixation of distal fibula 2] Right ankle open reduction internal fixation of syndesmosis   SURGEON:  Netta Cedars, MD   ASSISTANT: None   ANESTHESIA: General, regional   EBL: Minimal   TOURNIQUET:    Total Tourniquet Time Documented: Thigh (Right) - 37 minutes Total: Thigh (Right) - 37 minutes    COMPLICATIONS: None apparent   DISPOSITION: Extubated, awake and stable to recovery.   INDICATION FOR PROCEDURE: The patient presented with above diagnosis.  We discussed the diagnosis, alternative treatment options, risks and benefits of the above surgical intervention, as well as alternative non-operative treatments. All questions/concerns were addressed and the patient/family demonstrated appropriate understanding of the diagnosis, the procedure, the postoperative course, and overall prognosis. The patient wished to proceed with surgical intervention and signed an informed surgical consent as such, in each others presence prior to surgery.   PROCEDURE IN DETAIL: After preoperative consent was obtained and the correct operative site was identified, the patient was brought to the operating room supine on stretcher and transferred onto operating table. General anesthesia was induced. Preoperative antibiotics were administered. Surgical timeout was taken. The patient was then positioned supine with an ipsilateral hip bump. The operative lower extremity was prepped and draped in standard sterile fashion with a tourniquet around the thigh. The extremity was exsanguinated and the tourniquet was inflated to 275 mmHg.  A standard lateral incision was made over the distal fibula. Dissection was carried down to the level of the  fibula and the fracture site identified. The superficial peroneal nerve was identified and protected throughout the procedure. The fibula was noted to be shortened with interposed periosteum. The fibula was brought out to length. The fibula fracture was debrided and the edges defined to achieve cortical read. Reduction maneuver was performed using pointed reduction forceps and lobster forceps. In this manner, the fibula length was restored and fracture reduced. A lag screw was not placed given the orientation of fracture lines and comminution. Due to poor bone quality and extensive comminution at the fracture site, it was decided to use a locking distal fibula plate. We then selected a Zimmer locking plate to match the anatomy of the distal fibula and placed it laterally. This was implanted under intraoperative fluoroscopy with a combination of distal locking screws and proximal cortical & locking screws.  A manual external rotation stress radiograph was obtained and demonstrated widening of the ankle mortise. Given this intraoperative finding as well as preoperative subluxation, it was decided to reduce and fix the syndesmosis. Therefore a suture fixation system (ZipTight device) was implanted through the fibula plate in cannulated fashion to fix the syndesmosis. Anchor/button position was verified along anteromedial tibial cortex by fluoroscopy.   A repeat stress radiograph showed complete stability of the ankle mortise to testing of syndesmosis and deltoid.  The surgical sites were thoroughly irrigated. The tourniquet was deflated and hemostasis achieved. Betadine and vancomycin powder were applied. The deep layers were closed using 2-0 vicryl. The skin was closed without tension using 2-0 nylon suture.    The leg was cleaned with saline and sterile dressings with gauze were applied. A well padded bulky short leg splint was applied. The patient was awakened from anesthesia and transported to the recovery  room in stable condition.    FOLLOW UP PLAN: -transfer to PACU, then home -strict NWB operative extremity, maximum elevation -maintain short leg splint until follow up -DVT ppx: Aspirin 81 mg twice daily while NWB -smoking/drinking counseling -follow up as outpatient within 7-10 days for wound check with exchange of short leg splint to short leg cast -sutures out in 2-3 weeks in outpatient office   RADIOGRAPHS: AP, lateral, oblique and stress radiographs of the right ankle were obtained intraoperatively. These showed interval reduction and fixation of the fractures. Manual stress radiographs were taken and the ankle mortise and tibiofibular relationship were noted to be stable following fixation. All hardware is appropriately positioned and of the appropriate lengths. No other acute injuries are noted.   Netta Cedars [facility]

## 2023-02-09 ENCOUNTER — Encounter (HOSPITAL_COMMUNITY): Payer: Self-pay | Admitting: Orthopaedic Surgery

## 2023-02-13 ENCOUNTER — Ambulatory Visit (INDEPENDENT_AMBULATORY_CARE_PROVIDER_SITE_OTHER): Payer: BLUE CROSS/BLUE SHIELD | Admitting: Family Medicine

## 2023-02-13 ENCOUNTER — Encounter: Payer: Self-pay | Admitting: Family Medicine

## 2023-02-13 ENCOUNTER — Other Ambulatory Visit: Payer: Self-pay

## 2023-02-13 VITALS — BP 123/85 | HR 77 | Temp 98.1°F | Resp 20 | Ht 67.0 in | Wt 158.0 lb

## 2023-02-13 DIAGNOSIS — I10 Essential (primary) hypertension: Secondary | ICD-10-CM | POA: Diagnosis not present

## 2023-02-13 DIAGNOSIS — F17200 Nicotine dependence, unspecified, uncomplicated: Secondary | ICD-10-CM

## 2023-02-13 MED ORDER — HYDROCHLOROTHIAZIDE 25 MG PO TABS
ORAL_TABLET | Freq: Every day | ORAL | 1 refills | Status: AC
  Filled 2023-02-13: qty 90, 90d supply, fill #0
  Filled 2023-06-06 – 2023-06-22 (×3): qty 90, 90d supply, fill #1

## 2023-02-13 MED ORDER — LOSARTAN POTASSIUM 100 MG PO TABS
100.0000 mg | ORAL_TABLET | Freq: Every day | ORAL | 1 refills | Status: AC
  Filled 2023-02-13: qty 90, 90d supply, fill #0
  Filled 2023-06-06 – 2023-06-22 (×3): qty 90, 90d supply, fill #1

## 2023-02-13 MED ORDER — AMLODIPINE BESYLATE 10 MG PO TABS
10.0000 mg | ORAL_TABLET | Freq: Every day | ORAL | 1 refills | Status: AC
  Filled 2023-02-13: qty 90, 90d supply, fill #0
  Filled 2023-06-06 – 2023-06-22 (×3): qty 90, 90d supply, fill #1

## 2023-02-15 ENCOUNTER — Other Ambulatory Visit: Payer: Self-pay

## 2023-02-16 ENCOUNTER — Encounter: Payer: Self-pay | Admitting: Family Medicine

## 2023-02-16 ENCOUNTER — Other Ambulatory Visit: Payer: Self-pay

## 2023-02-16 NOTE — Progress Notes (Signed)
Established Patient Office Visit  Subjective    Patient ID: Jimmy Zimmerman, male    DOB: [birth year]  Age: 57 y.o. MRN: [id number]  CC: No chief complaint on file.   HPI Jimmy Zimmerman presents for routine follow up of hypertension. Patient denies acute complaints or concerns.    Outpatient Encounter Medications as of 02/13/2023  Medication Sig   acetaminophen (TYLENOL) 325 MG tablet Take 650 mg by mouth every 6 (six) hours as needed for moderate pain.   amLODipine (NORVASC) 10 MG tablet Take 1 tablet (10 mg total) by mouth daily.   hydrochlorothiazide (HYDRODIURIL) 25 MG tablet TAKE 1 TABLET (25 MG TOTAL) BY MOUTH DAILY.   losartan (COZAAR) 100 MG tablet Take 1 tablet (100 mg total) by mouth daily.   nicotine (NICODERM CQ) 21 mg/24hr patch Place 1 patch (21 mg total) onto the skin daily. (Patient not taking: Reported on 02/07/2023)   oxyCODONE-acetaminophen (PERCOCET) 5-325 MG tablet Take 1 tablet by mouth every 6 (six) hours as needed for moderate pain or severe pain. (Patient not taking: Reported on 02/07/2023)   [DISCONTINUED] amLODipine (NORVASC) 10 MG tablet Take 1 tablet (10 mg total) by mouth daily. (Patient not taking: Reported on 02/07/2023)   [DISCONTINUED] hydrochlorothiazide (HYDRODIURIL) 25 MG tablet TAKE 1 TABLET (25 MG TOTAL) BY MOUTH DAILY. (Patient not taking: Reported on 02/07/2023)   [DISCONTINUED] losartan (COZAAR) 100 MG tablet Take 1 tablet (100 mg total) by mouth daily. (Patient not taking: Reported on 02/07/2023)   No facility-administered encounter medications on file as of 02/13/2023.    Past Medical History:  Diagnosis Date   Hypertension    Pancreatitis    Substance abuse (HCC)    ETOH    Past Surgical History:  Procedure Laterality Date   HEMORRHOID SURGERY     ORIF ANKLE FRACTURE Right 02/08/2023   Procedure: OPEN REDUCTION INTERNAL FIXATION (ORIF) distal fibual ANKLE FRACTURE and syndemosis;  Surgeon: Netta Cedars, MD;  Location: [facility];  Service:  Orthopedics;  Laterality: Right;   right knee surgery      x3    Family History  Problem Relation Age of Onset   Hypertension Mother    Diabetes Mother    Stroke Maternal Grandmother     Social History   Socioeconomic History   Marital status: Single    Spouse name: Not on file   Number of children: Not on file   Years of education: Not on file   Highest education level: Not on file  Occupational History   Not on file  Tobacco Use   Smoking status: Every Day    Packs/day: 0.50    Years: 30.00    Additional pack years: 0.00    Total pack years: 15.00    Types: Cigarettes   Smokeless tobacco: Never  Vaping Use   Vaping Use: Never used  Substance and Sexual Activity   Alcohol use: Yes    Alcohol/week: 6.0 standard drinks of alcohol    Types: 6 Cans of beer per week    Comment: everyday    Drug use: No   Sexual activity: Yes  Other Topics Concern   Not on file  Social History Narrative   Not on file   Social Determinants of Health   Financial Resource Strain: Not on file  Food Insecurity: Not on file  Transportation Needs: Not on file  Physical Activity: Not on file  Stress: Not on file  Social Connections: Not on file  Intimate Partner  Violence: Not on file    Review of Systems  All other systems reviewed and are negative.       Objective    BP 123/85   Pulse 77   Temp 98.1 F (36.7 C) (Oral)   Resp 20   Ht 5\' 7"  (1.702 m)   Wt 158 lb (71.7 kg)   SpO2 96%   BMI 24.75 kg/m   Physical Exam Vitals and nursing note reviewed.  Constitutional:      General: He is not in acute distress. Cardiovascular:     Rate and Rhythm: Normal rate and regular rhythm.  Pulmonary:     Effort: Pulmonary effort is normal.     Breath sounds: Normal breath sounds.  Neurological:     General: No focal deficit present.     Mental Status: He is alert and oriented to person, place, and time.         Assessment & Plan:   1. Essential hypertension Appears  stable. Meds refilled. Continue.  2. Nicotine dependence, uncomplicated, unspecified nicotine product type  Discussed reduction/cessation.   No follow-ups on file.   Tommie Raymond, MD

## 2023-02-17 DIAGNOSIS — Z4889 Encounter for other specified surgical aftercare: Secondary | ICD-10-CM | POA: Diagnosis not present

## 2023-02-17 DIAGNOSIS — Z9889 Other specified postprocedural states: Secondary | ICD-10-CM | POA: Diagnosis not present

## 2023-02-17 DIAGNOSIS — S8261XD Displaced fracture of lateral malleolus of right fibula, subsequent encounter for closed fracture with routine healing: Secondary | ICD-10-CM | POA: Diagnosis not present

## 2023-02-20 DIAGNOSIS — Z419 Encounter for procedure for purposes other than remedying health state, unspecified: Secondary | ICD-10-CM | POA: Diagnosis not present

## 2023-03-23 DIAGNOSIS — S8261XA Displaced fracture of lateral malleolus of right fibula, initial encounter for closed fracture: Secondary | ICD-10-CM | POA: Diagnosis not present

## 2023-03-23 DIAGNOSIS — Z4889 Encounter for other specified surgical aftercare: Secondary | ICD-10-CM | POA: Diagnosis not present

## 2023-03-23 DIAGNOSIS — Z419 Encounter for procedure for purposes other than remedying health state, unspecified: Secondary | ICD-10-CM | POA: Diagnosis not present

## 2023-04-18 DIAGNOSIS — S8261XD Displaced fracture of lateral malleolus of right fibula, subsequent encounter for closed fracture with routine healing: Secondary | ICD-10-CM | POA: Diagnosis not present

## 2023-04-18 DIAGNOSIS — Z4889 Encounter for other specified surgical aftercare: Secondary | ICD-10-CM | POA: Diagnosis not present

## 2023-04-18 DIAGNOSIS — S8261XA Displaced fracture of lateral malleolus of right fibula, initial encounter for closed fracture: Secondary | ICD-10-CM | POA: Diagnosis not present

## 2023-04-23 DIAGNOSIS — Z419 Encounter for procedure for purposes other than remedying health state, unspecified: Secondary | ICD-10-CM | POA: Diagnosis not present

## 2023-05-23 DIAGNOSIS — Z419 Encounter for procedure for purposes other than remedying health state, unspecified: Secondary | ICD-10-CM | POA: Diagnosis not present

## 2023-05-30 DIAGNOSIS — Z4889 Encounter for other specified surgical aftercare: Secondary | ICD-10-CM | POA: Diagnosis not present

## 2023-05-30 DIAGNOSIS — S8261XA Displaced fracture of lateral malleolus of right fibula, initial encounter for closed fracture: Secondary | ICD-10-CM | POA: Diagnosis not present

## 2023-06-06 ENCOUNTER — Other Ambulatory Visit: Payer: Self-pay

## 2023-06-14 ENCOUNTER — Other Ambulatory Visit: Payer: Self-pay

## 2023-06-20 ENCOUNTER — Other Ambulatory Visit: Payer: Self-pay

## 2023-06-22 ENCOUNTER — Other Ambulatory Visit (HOSPITAL_COMMUNITY): Payer: Self-pay

## 2023-06-22 ENCOUNTER — Other Ambulatory Visit: Payer: Self-pay

## 2023-06-23 ENCOUNTER — Other Ambulatory Visit: Payer: Self-pay

## 2023-06-23 DIAGNOSIS — Z419 Encounter for procedure for purposes other than remedying health state, unspecified: Secondary | ICD-10-CM | POA: Diagnosis not present

## 2023-07-23 DIAGNOSIS — Z419 Encounter for procedure for purposes other than remedying health state, unspecified: Secondary | ICD-10-CM | POA: Diagnosis not present

## 2023-07-23 DEATH — deceased

## 2023-07-26 ENCOUNTER — Encounter: Payer: Self-pay | Admitting: Family Medicine

## 2023-07-28 ENCOUNTER — Other Ambulatory Visit: Payer: Self-pay

## 2023-07-31 ENCOUNTER — Other Ambulatory Visit: Payer: Self-pay

## 2023-08-07 ENCOUNTER — Ambulatory Visit: Payer: Medicaid Other | Admitting: Family Medicine

## 2023-08-14 ENCOUNTER — Ambulatory Visit: Payer: Medicaid Other | Admitting: Family Medicine

## 2023-08-23 DIAGNOSIS — Z419 Encounter for procedure for purposes other than remedying health state, unspecified: Secondary | ICD-10-CM | POA: Diagnosis not present

## 2023-09-23 DIAGNOSIS — Z419 Encounter for procedure for purposes other than remedying health state, unspecified: Secondary | ICD-10-CM | POA: Diagnosis not present

## 2023-10-21 DIAGNOSIS — Z419 Encounter for procedure for purposes other than remedying health state, unspecified: Secondary | ICD-10-CM | POA: Diagnosis not present

## 2023-11-14 ENCOUNTER — Other Ambulatory Visit (HOSPITAL_COMMUNITY): Payer: Self-pay

## 2023-12-02 DIAGNOSIS — Z419 Encounter for procedure for purposes other than remedying health state, unspecified: Secondary | ICD-10-CM | POA: Diagnosis not present

## 2024-01-01 DIAGNOSIS — Z419 Encounter for procedure for purposes other than remedying health state, unspecified: Secondary | ICD-10-CM | POA: Diagnosis not present

## 2024-02-01 DIAGNOSIS — Z419 Encounter for procedure for purposes other than remedying health state, unspecified: Secondary | ICD-10-CM | POA: Diagnosis not present

## 2024-03-02 DIAGNOSIS — Z419 Encounter for procedure for purposes other than remedying health state, unspecified: Secondary | ICD-10-CM | POA: Diagnosis not present

## 2024-04-02 DIAGNOSIS — Z419 Encounter for procedure for purposes other than remedying health state, unspecified: Secondary | ICD-10-CM | POA: Diagnosis not present

## 2024-05-03 DIAGNOSIS — Z419 Encounter for procedure for purposes other than remedying health state, unspecified: Secondary | ICD-10-CM | POA: Diagnosis not present
